# Patient Record
Sex: Female | Born: 1985 | Race: White | Hispanic: No | Smoking: Current every day smoker
Health system: Southern US, Community
[De-identification: ages and names within clinical notes are randomized; demographics above are authoritative.]

## PROBLEM LIST (undated history)

## (undated) DIAGNOSIS — F429 Obsessive-compulsive disorder, unspecified: Secondary | ICD-10-CM

## (undated) DIAGNOSIS — F329 Major depressive disorder, single episode, unspecified: Secondary | ICD-10-CM

## (undated) DIAGNOSIS — I2699 Other pulmonary embolism without acute cor pulmonale: Secondary | ICD-10-CM

## (undated) DIAGNOSIS — I10 Essential (primary) hypertension: Secondary | ICD-10-CM

## (undated) DIAGNOSIS — F419 Anxiety disorder, unspecified: Secondary | ICD-10-CM

## (undated) DIAGNOSIS — F32A Depression, unspecified: Secondary | ICD-10-CM

## (undated) HISTORY — PX: ABDOMINAL HYSTERECTOMY: SHX81

## (undated) HISTORY — PX: APPENDECTOMY: SHX54

---

## 2018-05-07 ENCOUNTER — Emergency Department (HOSPITAL_COMMUNITY)
Admission: EM | Admit: 2018-05-07 | Discharge: 2018-05-07 | Disposition: A | Discharge status: Home / Self Care | Payer: Self-pay | Attending: Emergency Medicine | Admitting: Emergency Medicine

## 2018-05-07 ENCOUNTER — Other Ambulatory Visit: Payer: Self-pay

## 2018-05-07 ENCOUNTER — Encounter (HOSPITAL_COMMUNITY): Payer: Self-pay | Admitting: Emergency Medicine

## 2018-05-07 DIAGNOSIS — F10920 Alcohol use, unspecified with intoxication, uncomplicated: Secondary | ICD-10-CM | POA: Insufficient documentation

## 2018-05-07 DIAGNOSIS — R1110 Vomiting, unspecified: Secondary | ICD-10-CM | POA: Insufficient documentation

## 2018-05-07 DIAGNOSIS — F17210 Nicotine dependence, cigarettes, uncomplicated: Secondary | ICD-10-CM | POA: Insufficient documentation

## 2018-05-07 DIAGNOSIS — I10 Essential (primary) hypertension: Secondary | ICD-10-CM | POA: Insufficient documentation

## 2018-05-07 HISTORY — DX: Major depressive disorder, single episode, unspecified: F32.9

## 2018-05-07 HISTORY — DX: Essential (primary) hypertension: I10

## 2018-05-07 HISTORY — DX: Obsessive-compulsive disorder, unspecified: F42.9

## 2018-05-07 HISTORY — DX: Depression, unspecified: F32.A

## 2018-05-07 HISTORY — DX: Anxiety disorder, unspecified: F41.9

## 2018-05-07 NOTE — ED Notes (Signed)
Patient father came to pick her and her boyfriend up [phone].
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
## 2018-05-07 NOTE — ED Provider Notes (Signed)
   [facility]-EMERGENCY DEPT Provider Note: Lowella DellJ. Lane Nashly Olsson, MD, FACEP  CSN: [id number] MRN: [id number] ARRIVAL: 05/07/18 at 0105 ROOM: [facility]   CHIEF COMPLAINT  Alcohol Intoxication   HISTORY OF PRESENT ILLNESS  05/07/18 1:19 AM Gabrielle Rose is a 32 y.o. female who went out drinking last night to celebrate her upcoming birthday.  She had multiple beers, shots of liquor and mixed drinks.  She was brought by ambulance for intoxication.  She admits to "drinking too much".  She has been vomiting but declined Zofran due to it causing headaches.  She denies abdominal pain.   Past Medical History:  Diagnosis Date  . Anxiety   . Depression   . Hypertension   . OCD (obsessive compulsive disorder)     History reviewed. No pertinent surgical history.  History reviewed. No pertinent family history.  Social History   Tobacco Use  . Smoking status: Current Every Day Smoker    Types: Cigarettes  . Smokeless tobacco: Never Used  Substance Use Topics  . Alcohol use: Yes  . Drug use: Never    Prior to Admission medications   Not on File    Allergies Erythromycin; Macrobid [nitrofurantoin macrocrystal]; Penicillins; and Zofran [ondansetron hcl]   REVIEW OF SYSTEMS  Negative except as noted here or in the History of Present Illness.   PHYSICAL EXAMINATION  Initial Vital Signs Blood pressure (!) 143/85, pulse (!) 106, temperature 97.6 F (36.4 C), temperature source Oral, resp. rate 18, height 5' (1.524 m), weight 120.2 kg (265 lb), SpO2 94 %.  Examination General: Well-developed, well-nourished female in no acute distress; appearance consistent with age of record HENT: normocephalic; atraumatic Eyes: pupils equal, round and reactive to light; extraocular muscles intact Neck: supple Heart: regular rate and rhythm Lungs: clear to auscultation bilaterally Abdomen: soft; nondistended; nontender; bowel sounds present Extremities: No deformity; full range of motion; pulses  normal Neurologic: Awake, alert and oriented; motor function intact in all extremities and symmetric; no facial droop Skin: Warm and dry Psychiatric: Flat affect   RESULTS  Summary of this visit's results, reviewed by myself:   EKG Interpretation  Date/Time:    Ventricular Rate:    PR Interval:    QRS Duration:   QT Interval:    QTC Calculation:   R Axis:     Text Interpretation:        Laboratory Studies: No results found for this or any previous visit (from the past 24 hour(s)). Imaging Studies: No results found.  ED COURSE and MDM  Nursing notes and initial vitals signs, including pulse oximetry, reviewed.  Vitals:   05/07/18 0107 05/07/18 0117  BP: (!) 143/85   Pulse: (!) 106   Resp: 18   Temp: 97.6 F (36.4 C)   TempSrc: Oral   SpO2: 94%   Weight:  120.2 kg (265 lb)  Height:  5' (1.524 m)   2:13 AM Patient's father here ready to take her home.  She appears awake and alert enough to be discharged at this time.  PROCEDURES    ED DIAGNOSES     ICD-10-CM   1. Alcoholic intoxication without complication (HCC) F10.920        Toccara Alford, Jonny RuizJohn, MD 05/07/18 [id number]

## 2018-07-09 ENCOUNTER — Emergency Department (INDEPENDENT_AMBULATORY_CARE_PROVIDER_SITE_OTHER): Payer: BLUE CROSS/BLUE SHIELD

## 2018-07-09 ENCOUNTER — Emergency Department (INDEPENDENT_AMBULATORY_CARE_PROVIDER_SITE_OTHER)
Admission: EM | Admit: 2018-07-09 | Discharge: 2018-07-09 | Disposition: A | Discharge status: Home / Self Care | Payer: BLUE CROSS/BLUE SHIELD | Source: Home / Self Care | Attending: Family Medicine | Admitting: Family Medicine

## 2018-07-09 ENCOUNTER — Other Ambulatory Visit: Payer: Self-pay

## 2018-07-09 DIAGNOSIS — M79671 Pain in right foot: Secondary | ICD-10-CM

## 2018-07-09 DIAGNOSIS — W5503XA Scratched by cat, initial encounter: Secondary | ICD-10-CM

## 2018-07-09 DIAGNOSIS — S9031XA Contusion of right foot, initial encounter: Secondary | ICD-10-CM | POA: Diagnosis not present

## 2018-07-09 NOTE — Discharge Instructions (Addendum)
°  You may take 500mg  acetaminophen every 4-6 hours or in combination with ibuprofen 400-600mg  every 6-8 hours as needed for pain and inflammation.  Keep cat scratch on your toe clean with warm water and mild soap. You may apply and over the counter antibiotic ointment for 4-5 days to help with healing. Please have it reevaluated if concerned for infection- increased pain, redness, drainage or pus.   Please follow up with family medicine in 1-2 weeks if not improving.

## 2018-07-09 NOTE — ED Provider Notes (Signed)
[facility]    CSN: [id number] Arrival date & time: 07/09/18  1253     History   Chief Complaint Chief Complaint  Patient presents with  . Foot Injury    HPI Gabrielle Rose is a 32 y.o. female.   HPI  Gabrielle Rose is a 32 y.o. female presenting to UC with c/o Right foot pain at the base of her 3rd through 5th toes after her cat knocked down several cans of cat food onto her foot 2 days ago. The cat also scratched her 4th toe. Pain is sharp, aching, and worse with ambulation despite trying to buddy tape her toes.  He believes she may have fractured her Right little toe in the past but not the 3rd or 4th toes.     Past Medical History:  Diagnosis Date  . Anxiety   . Depression   . Hypertension   . OCD (obsessive compulsive disorder)     There are no active problems to display for this patient.   History reviewed. No pertinent surgical history.  OB History   None      Home Medications    Prior to Admission medications   Medication Sig Start Date End Date Taking? Authorizing Provider  ALPRAZolam Prudy Feeler) 1 MG tablet Take 1 mg by mouth at bedtime as needed for anxiety.   Yes [provider]  escitalopram (LEXAPRO) 10 MG tablet Take 10 mg by mouth daily.   Yes [provider]  pantoprazole (PROTONIX) 40 MG tablet Take 40 mg by mouth daily.   Yes [provider]  QUEtiapine (SEROQUEL) 25 MG tablet Take 25 mg by mouth at bedtime.   Yes [provider]  topiramate (TOPAMAX) 50 MG tablet Take 50 mg by mouth 2 (two) times daily.   Yes [provider]    Family History History reviewed. No pertinent family history.  Social History Social History   Tobacco Use  . Smoking status: Current Every Day Smoker    Types: Cigarettes  . Smokeless tobacco: Never Used  Substance Use Topics  . Alcohol use: Yes  . Drug use: Never     Allergies   Erythromycin; Macrobid [nitrofurantoin macrocrystal];  Penicillins; and Zofran [ondansetron hcl]   Review of Systems Review of Systems  Musculoskeletal: Positive for arthralgias, gait problem and joint swelling. Negative for myalgias.  Skin: Positive for wound. Negative for color change and rash.  Neurological: Negative for weakness and numbness.     Physical Exam Triage Vital Signs ED Triage Vitals [07/09/18 1312]  Enc Vitals Group     BP 121/87     Pulse Rate (!) 108     Resp      Temp 98.2 F (36.8 C)     Temp Source Oral     SpO2 100 %     Weight      Height      Head Circumference      Peak Flow      Pain Score      Pain Loc      Pain Edu?      Excl. in GC?    No data found.  Updated Vital Signs BP 121/87 (BP Location: Right Arm)   Pulse (!) 108   Temp 98.2 F (36.8 C) (Oral)   Ht 5\' 2"  (1.575 m)   Wt 253 lb 12.8 oz (115.1 kg)   SpO2 100%   BMI 46.42 kg/m   Visual Acuity Right Eye Distance:  Left Eye Distance:   Bilateral Distance:    Right Eye Near:   Left Eye Near:    Bilateral Near:     Physical Exam  Constitutional: She is oriented to person, place, and time. She appears well-developed and well-nourished.  HENT:  Head: Normocephalic and atraumatic.  Eyes: EOM are normal.  Neck: Normal range of motion.  Cardiovascular: Normal rate.  Pulmonary/Chest: Effort normal.  Musculoskeletal: Normal range of motion. She exhibits edema and tenderness.  Right foot: mild edema at base of 3rd-5th toes. Tenderness.   Neurological: She is alert and oriented to person, place, and time.  Skin: Skin is warm and dry. Capillary refill takes less than 2 seconds. No erythema.  Right 4th toe: superficial abrasion c/w reported cat scratch. No erythema or warmth. No drainage. No red streaking.   Psychiatric: She has a normal mood and affect. Her behavior is normal.  Nursing note and vitals reviewed.    UC Treatments / Results  Labs (all labs ordered are listed, but only abnormal results are displayed) Labs Reviewed  - No data to display  EKG None  Radiology Dg Foot Complete Right  Result Date: 07/09/2018 CLINICAL DATA:  Right foot pain since the patient dropped cat food on her foot 2 days ago. Initial encounter. EXAM: RIGHT FOOT COMPLETE - 3+ VIEW COMPARISON:  None. FINDINGS: There is no evidence of fracture or dislocation. There is no evidence of arthropathy or other focal bone abnormality. Small plantar calcaneal spur incidentally noted. Soft tissues are unremarkable. IMPRESSION: Negative exam. Electronically Signed   By: Drusilla Kanner M.D.   On: 07/09/2018 13:32    Procedures Procedures (including critical care time)  Medications Ordered in UC Medications - No data to display  Initial Impression / Assessment and Plan / UC Course  I have reviewed the triage vital signs and the nursing notes.  Pertinent labs & imaging results that were available during my care of the patient were reviewed by me and considered in my medical decision making (see chart for details).     Reviewed imaging with pt. Reassured pt of no fracture. Ace wrap provided for comfort. Pt declined crutches Encouraged to continue home treatments for comfort.  Final Clinical Impressions(s) / UC Diagnoses   Final diagnoses:  Right foot pain  Contusion of right foot, initial encounter  Cat scratch     Discharge Instructions      You may take 500mg  acetaminophen every 4-6 hours or in combination with ibuprofen 400-600mg  every 6-8 hours as needed for pain and inflammation.  Keep cat scratch on your toe clean with warm water and mild soap. You may apply and over the counter antibiotic ointment for 4-5 days to help with healing. Please have it reevaluated if concerned for infection- increased pain, redness, drainage or pus.   Please follow up with family medicine in 1-2 weeks if not improving.     ED Prescriptions    None     Controlled Substance Prescriptions [place] Controlled Substance Registry consulted? Not  Applicable   Lurene Shadow, PA-C 07/09/18 1422

## 2018-07-09 NOTE — ED Triage Notes (Signed)
Here with right 4th digit toe pain. Cat food fell on foot x2 dys ago. Sharp,achy pain noted with ambulation. Buddy taped injured toes.

## 2021-12-07 ENCOUNTER — Other Ambulatory Visit: Payer: Self-pay

## 2021-12-07 ENCOUNTER — Emergency Department (HOSPITAL_COMMUNITY): Payer: Medicaid Other

## 2021-12-07 ENCOUNTER — Emergency Department (HOSPITAL_COMMUNITY)
Admission: EM | Admit: 2021-12-07 | Discharge: 2021-12-07 | Disposition: A | Discharge status: Home / Self Care | Payer: Medicaid Other | Attending: Emergency Medicine | Admitting: Emergency Medicine

## 2021-12-07 ENCOUNTER — Encounter (HOSPITAL_COMMUNITY): Payer: Self-pay

## 2021-12-07 DIAGNOSIS — Z7901 Long term (current) use of anticoagulants: Secondary | ICD-10-CM | POA: Diagnosis not present

## 2021-12-07 DIAGNOSIS — R000 Tachycardia, unspecified: Secondary | ICD-10-CM | POA: Diagnosis not present

## 2021-12-07 DIAGNOSIS — M5441 Lumbago with sciatica, right side: Secondary | ICD-10-CM | POA: Insufficient documentation

## 2021-12-07 DIAGNOSIS — X509XXA Other and unspecified overexertion or strenuous movements or postures, initial encounter: Secondary | ICD-10-CM | POA: Insufficient documentation

## 2021-12-07 DIAGNOSIS — M5442 Lumbago with sciatica, left side: Secondary | ICD-10-CM | POA: Diagnosis not present

## 2021-12-07 HISTORY — DX: Other pulmonary embolism without acute cor pulmonale: I26.99

## 2021-12-07 LAB — URINALYSIS, ROUTINE W REFLEX MICROSCOPIC
Bilirubin Urine: NEGATIVE
Glucose, UA: NEGATIVE mg/dL
Hgb urine dipstick: NEGATIVE
Ketones, ur: NEGATIVE mg/dL
Leukocytes,Ua: NEGATIVE
Nitrite: NEGATIVE
Protein, ur: NEGATIVE mg/dL
Specific Gravity, Urine: 1.003 — ABNORMAL LOW (ref 1.005–1.030)
pH: 7 (ref 5.0–8.0)

## 2021-12-07 LAB — BASIC METABOLIC PANEL
Anion gap: 10 (ref 5–15)
BUN: 8 mg/dL (ref 6–20)
CO2: 22 mmol/L (ref 22–32)
Calcium: 9.2 mg/dL (ref 8.9–10.3)
Chloride: 107 mmol/L (ref 98–111)
Creatinine, Ser: 0.67 mg/dL (ref 0.44–1.00)
GFR, Estimated: >60 mL/min (ref >60)
Glucose, Bld: 92 mg/dL (ref 70–99)
Potassium: 4 mmol/L (ref 3.5–5.1)
Sodium: 139 mmol/L (ref 135–145)

## 2021-12-07 LAB — CBC WITH DIFFERENTIAL/PLATELET
Abs Immature Granulocytes: 0.02 10*3/uL (ref 0.00–0.07)
Basophils Absolute: 0.1 10*3/uL (ref 0.0–0.1)
Basophils Relative: 1 %
Eosinophils Absolute: 0.8 10*3/uL — ABNORMAL HIGH (ref 0.0–0.5)
Eosinophils Relative: 8 %
HCT: 30 % — ABNORMAL LOW (ref 36.0–46.0)
Hemoglobin: 8.8 g/dL — ABNORMAL LOW (ref 12.0–15.0)
Immature Granulocytes: 0 %
Lymphocytes Relative: 22 %
Lymphs Abs: 2.2 10*3/uL (ref 0.7–4.0)
MCH: 22.8 pg — ABNORMAL LOW (ref 26.0–34.0)
MCHC: 29.3 g/dL — ABNORMAL LOW (ref 30.0–36.0)
MCV: 77.7 fL — ABNORMAL LOW (ref 80.0–100.0)
Monocytes Absolute: 0.4 10*3/uL (ref 0.1–1.0)
Monocytes Relative: 4 %
Neutro Abs: 6.5 10*3/uL (ref 1.7–7.7)
Neutrophils Relative %: 65 %
Platelets: 397 10*3/uL (ref 150–400)
RBC: 3.86 MIL/uL — ABNORMAL LOW (ref 3.87–5.11)
RDW: 17.2 % — ABNORMAL HIGH (ref 11.5–15.5)
WBC: 9.9 10*3/uL (ref 4.0–10.5)
nRBC: 0 % (ref 0.0–0.2)

## 2021-12-07 LAB — I-STAT BETA HCG BLOOD, ED (MC, WL, AP ONLY): I-stat hCG, quantitative: <5 m[IU]/mL (ref <5)

## 2021-12-07 MED ORDER — LORAZEPAM 2 MG/ML IJ SOLN
2.0000 mg | Freq: Once | INTRAMUSCULAR | Status: AC
  Administered 2021-12-07: 2 mg via INTRAVENOUS
  Filled 2021-12-07: qty 1

## 2021-12-07 MED ORDER — OXYCODONE HCL 5 MG PO TABS
5.0000 mg | ORAL_TABLET | Freq: Once | ORAL | Status: AC
  Administered 2021-12-07: 5 mg via ORAL
  Filled 2021-12-07: qty 1

## 2021-12-07 MED ORDER — METHOCARBAMOL 500 MG PO TABS: 500.0000 mg | ORAL_TABLET | Freq: Two times a day (BID) | ORAL | 0 refills | Status: AC

## 2021-12-07 MED ORDER — DEXAMETHASONE SODIUM PHOSPHATE 10 MG/ML IJ SOLN
16.0000 mg | Freq: Once | INTRAMUSCULAR | Status: AC
  Administered 2021-12-07: 16 mg via INTRAVENOUS
  Filled 2021-12-07: qty 2

## 2021-12-07 MED ORDER — DEXAMETHASONE SODIUM PHOSPHATE 10 MG/ML IJ SOLN
10.0000 mg | Freq: Once | INTRAMUSCULAR | Status: DC
  Filled 2021-12-07: qty 1

## 2021-12-07 MED ORDER — GADOBUTROL 1 MMOL/ML IV SOLN
7.0000 mL | Freq: Once | INTRAVENOUS | Status: AC | PRN
  Administered 2021-12-07: 7 mL via INTRAVENOUS
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
## 2021-12-07 NOTE — ED Notes (Addendum)
To MRI with pulse ox ?
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
## 2021-12-07 NOTE — Discharge Instructions (Addendum)
You may use Tylenol for your pain.  Gabapentin may also help.  I have sent muscle relaxants to your pharmacy.  Do not take these while driving as they may make you tired. ? ?I have attached a neurosurgery office for you to follow-up with in the event that you continue to have back pain and numbness or urinary incontinence.  You may follow-up with your primary care provider as needed for chronic back pain. ?

## 2021-12-18 ENCOUNTER — Emergency Department (HOSPITAL_COMMUNITY)
Admission: EM | Admit: 2021-12-18 | Discharge: 2021-12-19 | Disposition: A | Discharge status: Home / Self Care | Payer: Medicaid Other | Attending: Emergency Medicine | Admitting: Emergency Medicine

## 2021-12-18 ENCOUNTER — Emergency Department (HOSPITAL_COMMUNITY): Payer: Medicaid Other

## 2021-12-18 DIAGNOSIS — R102 Pelvic and perineal pain: Secondary | ICD-10-CM | POA: Insufficient documentation

## 2021-12-18 DIAGNOSIS — D509 Iron deficiency anemia, unspecified: Secondary | ICD-10-CM | POA: Diagnosis not present

## 2021-12-18 DIAGNOSIS — Z7901 Long term (current) use of anticoagulants: Secondary | ICD-10-CM | POA: Diagnosis not present

## 2021-12-18 DIAGNOSIS — I10 Essential (primary) hypertension: Secondary | ICD-10-CM | POA: Insufficient documentation

## 2021-12-18 DIAGNOSIS — D75839 Thrombocytosis, unspecified: Secondary | ICD-10-CM | POA: Insufficient documentation

## 2021-12-18 LAB — CBC WITH DIFFERENTIAL/PLATELET
Abs Immature Granulocytes: 0.02 10*3/uL (ref 0.00–0.07)
Basophils Absolute: 0.1 10*3/uL (ref 0.0–0.1)
Basophils Relative: 1 %
Eosinophils Absolute: 0.5 10*3/uL (ref 0.0–0.5)
Eosinophils Relative: 6 %
HCT: 29.2 % — ABNORMAL LOW (ref 36.0–46.0)
Hemoglobin: 8.6 g/dL — ABNORMAL LOW (ref 12.0–15.0)
Immature Granulocytes: 0 %
Lymphocytes Relative: 36 %
Lymphs Abs: 2.7 10*3/uL (ref 0.7–4.0)
MCH: 23 pg — ABNORMAL LOW (ref 26.0–34.0)
MCHC: 29.5 g/dL — ABNORMAL LOW (ref 30.0–36.0)
MCV: 78.1 fL — ABNORMAL LOW (ref 80.0–100.0)
Monocytes Absolute: 0.4 10*3/uL (ref 0.1–1.0)
Monocytes Relative: 5 %
Neutro Abs: 3.9 10*3/uL (ref 1.7–7.7)
Neutrophils Relative %: 52 %
Platelets: 521 10*3/uL — ABNORMAL HIGH (ref 150–400)
RBC: 3.74 MIL/uL — ABNORMAL LOW (ref 3.87–5.11)
RDW: 17.2 % — ABNORMAL HIGH (ref 11.5–15.5)
WBC: 7.6 10*3/uL (ref 4.0–10.5)
nRBC: 0 % (ref 0.0–0.2)

## 2021-12-18 LAB — COMPREHENSIVE METABOLIC PANEL
ALT: 15 U/L (ref 0–44)
AST: 16 U/L (ref 15–41)
Albumin: 4.1 g/dL (ref 3.5–5.0)
Alkaline Phosphatase: 81 U/L (ref 38–126)
Anion gap: 11 (ref 5–15)
BUN: 7 mg/dL (ref 6–20)
CO2: 23 mmol/L (ref 22–32)
Calcium: 9.2 mg/dL (ref 8.9–10.3)
Chloride: 104 mmol/L (ref 98–111)
Creatinine, Ser: 0.75 mg/dL (ref 0.44–1.00)
GFR, Estimated: >60 mL/min (ref >60)
Glucose, Bld: 93 mg/dL (ref 70–99)
Potassium: 3.7 mmol/L (ref 3.5–5.1)
Sodium: 138 mmol/L (ref 135–145)
Total Bilirubin: 0.4 mg/dL (ref 0.3–1.2)
Total Protein: 7.1 g/dL (ref 6.5–8.1)

## 2021-12-18 LAB — URINALYSIS, ROUTINE W REFLEX MICROSCOPIC
Bilirubin Urine: NEGATIVE
Glucose, UA: NEGATIVE mg/dL
Hgb urine dipstick: NEGATIVE
Ketones, ur: NEGATIVE mg/dL
Leukocytes,Ua: NEGATIVE
Nitrite: NEGATIVE
Protein, ur: NEGATIVE mg/dL
Specific Gravity, Urine: 1.01 (ref 1.005–1.030)
pH: 7 (ref 5.0–8.0)

## 2021-12-18 LAB — LIPASE, BLOOD: Lipase: 28 U/L (ref 11–51)

## 2021-12-18 MED ORDER — PROCHLORPERAZINE EDISYLATE 10 MG/2ML IJ SOLN
10.0000 mg | Freq: Once | INTRAMUSCULAR | Status: AC
  Administered 2021-12-19: 10 mg via INTRAVENOUS
  Filled 2021-12-18: qty 2

## 2021-12-18 MED ORDER — IOHEXOL 300 MG/ML  SOLN
100.0000 mL | Freq: Once | INTRAMUSCULAR | Status: AC | PRN
  Administered 2021-12-18: 100 mL via INTRAVENOUS

## 2021-12-18 MED ORDER — MORPHINE SULFATE (PF) 4 MG/ML IV SOLN
4.0000 mg | Freq: Once | INTRAVENOUS | Status: AC
  Administered 2021-12-19: 4 mg via INTRAVENOUS
  Filled 2021-12-18: qty 1

## 2021-12-18 NOTE — ED Provider Notes (Signed)
?[facility] EMERGENCY DEPARTMENT ?Provider Note ? ? ?CSN: [id number] ?Arrival date & time: 12/18/21  2018 ? ?  ? ?History ? ?Chief Complaint  ?Patient presents with  ? Abdominal Pain  ? ? ?Gabrielle Rose is a 36 y.o. female. ? ?The history is provided by the patient.  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  Pain is moderately severe and she rates it at 8/10.  Nothing makes it better, nothing makes it worse.  There is associated nausea but no vomiting.  She denies any constipation or diarrhea and denies any urinary difficulty.  She has been having difficulty with ovarian cysts since a hysterectomy several years ago.  She does have history of polycystic ovarian disease and also has history of a right-sided dermoid cyst.  Pain she is having today is similar to what she has had with other ovarian cysts. ?  ?Home Medications ?Prior to Admission medications   ?Medication Sig Start Date End Date Taking? Authorizing Provider  ?albuterol (PROVENTIL) (2.5 MG/3ML) 0.083% nebulizer solution Take 2.5 mg by nebulization every 4 (four) hours as needed for wheezing or shortness of breath. 10/19/21   [provider]  ?ALPRAZolam Prudy Feeler) 1 MG tablet Take 1 mg by mouth at bedtime as needed for anxiety.    [provider]  ?escitalopram (LEXAPRO) 10 MG tablet Take 10 mg by mouth daily.    [provider]  ?gabapentin (NEURONTIN) 300 MG capsule Take 300 mg by mouth 2 (two) times daily.    [provider]  ?methocarbamol (ROBAXIN) 500 MG tablet Take 1 tablet (500 mg total) by mouth 2 (two) times daily. 12/07/21   Redwine, Madison A, PA-C  ?pantoprazole (PROTONIX) 40 MG tablet Take 40 mg by mouth daily.    [provider]  ?QUEtiapine (SEROQUEL) 25 MG tablet Take 25 mg by mouth at bedtime.    [provider]  ?rivaroxaban  (XARELTO) 20 MG TABS tablet Take 20 mg by mouth daily with supper.    [provider]  ?topiramate (TOPAMAX) 50 MG tablet Take 50 mg by mouth 2 (two) times daily.    [provider]  ?   ? ?Allergies    ?Erythromycin, Macrobid [nitrofurantoin macrocrystal], Penicillins, Nsaids, and Zofran [ondansetron hcl]   ? ?Review of Systems   ?Review of Systems  ?Gastrointestinal:  Positive for abdominal pain.  ?All other systems reviewed and are negative. ? ?Physical Exam ?Updated Vital Signs ?BP (!) 135/97   Pulse 98   Temp 98.2 ?F (36.8 ?C) (Oral)   Resp 20   SpO2 100%  ?Physical Exam ?Vitals and nursing note reviewed.  ?36 year old female, resting comfortably and in no acute distress. Vital signs are significant for elevated blood pressure. Oxygen saturation is 100%, which is normal. ?Head is normocephalic and atraumatic. PERRLA, EOMI. Oropharynx is clear. ?Neck is nontender and supple without adenopathy or JVD. ?Back is nontender and there is no CVA tenderness. ?Lungs are clear without rales, wheezes, or rhonchi. ?Chest is nontender. ?Heart has regular rate and rhythm without murmur. ?Abdomen is soft, flat, with mild right suprapubic tenderness.  There is no rebound or guarding.  There are no masses or hepatosplenomegaly and peristalsis is hypoactive. ?Extremities have no cyanosis or edema, full range of motion is present. ?Skin is warm and dry without rash. ?Neurologic: Mental status is normal, cranial nerves are intact, moves all extremities equally. ? ?  ED Results / Procedures / Treatments   ?Labs ?(all labs ordered are listed, but only abnormal results are displayed) ?Labs Reviewed  ?CBC WITH DIFFERENTIAL/PLATELET - Abnormal; Notable for the following components:  ?    Result Value  ? RBC 3.74 (*)   ? Hemoglobin 8.6 (*)   ? HCT 29.2 (*)   ? MCV 78.1 (*)   ? MCH 23.0 (*)   ? MCHC 29.5 (*)   ? RDW 17.2 (*)   ? Platelets 521 (*)   ? All other components within normal limits  ?URINALYSIS, ROUTINE W  REFLEX MICROSCOPIC - Abnormal; Notable for the following components:  ? APPearance CLOUDY (*)   ? All other components within normal limits  ?LIPASE, BLOOD  ?COMPREHENSIVE METABOLIC PANEL  ? ?Radiology ?CT ABDOMEN PELVIS W CONTRAST ? ?Result Date: 12/18/2021 ?CLINICAL DATA:  Right lower quadrant pain. EXAM: CT ABDOMEN AND PELVIS WITH CONTRAST TECHNIQUE: Multidetector CT imaging of the abdomen and pelvis was performed using the standard protocol following bolus administration of intravenous contrast. RADIATION DOSE REDUCTION: This exam was performed according to the departmental dose-optimization program which includes automated exposure control, adjustment of the mA and/or kV according to patient size and/or use of iterative reconstruction technique. CONTRAST:  OMNIPAQUE IOHEXOL 300 MG/ML  SOLN COMPARISON:  Feb 13, 2010 FINDINGS: Lower chest: No acute abnormality. Hepatobiliary: No focal liver abnormality is seen. No gallstones, gallbladder wall thickening, or biliary dilatation. Pancreas: Unremarkable. No pancreatic ductal dilatation or surrounding inflammatory changes. Spleen: Normal in size without focal abnormality. Adrenals/Urinary Tract: Adrenal glands are unremarkable. Kidneys are normal, without renal calculi, focal lesion, or hydronephrosis. Bladder is unremarkable. Stomach/Bowel: Stomach is within normal limits. The appendix is surgically absent. No evidence of bowel wall thickening, distention, or inflammatory changes. Vascular/Lymphatic: No significant vascular findings are present. No enlarged abdominal or pelvic lymph nodes. Reproductive: Uterus is not identified. The bilateral adnexa are unremarkable. Other: No abdominal wall hernia or abnormality. No abdominopelvic ascites. Musculoskeletal: No acute or significant osseous findings. IMPRESSION: 1. No acute or active process within the abdomen or pelvis. 2. Evidence of prior appendectomy. Electronically Signed   By: Aram Candela M.D.   On:  12/18/2021 23:40   ? ?Procedures ?Procedures  ? ? ?Medications Ordered in ED ?Medications  ?morphine (PF) 4 MG/ML injection 4 mg (has no administration in time range)  ?prochlorperazine (COMPAZINE) injection 10 mg (has no administration in time range)  ?iohexol (OMNIPAQUE) 300 MG/ML solution 100 mL (100 mLs Intravenous Contrast Given 12/18/21 2336)  ? ? ?ED Course/ Medical Decision Making/ A&P ?  ?                        ?Medical Decision Making ?Risk ?Prescription drug management. ? ? ?Right sided pelvic pain and patient with history of ovarian cyst, likely recurrent ovarian cyst.  She is status post hysterectomy and appendectomy, so appendicitis and PID are not diagnostic considerations.  Possible UTI, ureterolithiasis, diverticulitis, endometriosis, pelvic adhesions.  Labs show stable anemia.  Thrombocytosis is present.  Urinalysis is unremarkable.  She is sent for CT of abdomen and pelvis to look for possible you ureteral calculus, possible diverticulitis.  CT scan shows no acute process, no significant ovarian cyst.  I have independently viewed the images, and agree with the radiologist's interpretation.  She is referred back to her gynecologist for further outpatient work-up.  She is given prescription for small number of hydrocodone tablets and also prescription for metoclopramide.  Return  precautions discussed. ? ? ? ? ? ? ? ?Final Clinical Impression(s) / ED Diagnoses ?Final diagnoses:  ?Pelvic pain  ?Microcytic anemia  ?Thrombocytosis  ? ? ?Rx / DC Orders ?ED Discharge Orders   ? ?      Ordered  ?  HYDROcodone-acetaminophen (NORCO) 5-325 MG tablet  Every 4 hours PRN       ? 12/19/21 0051  ?  metoCLOPramide (REGLAN) 10 MG tablet  Every 6 hours       ? 12/19/21 0051  ? ?  ?  ? ?  ? ? ?  ?Dione BoozeGlick, Lexa Coronado, MD ?12/19/21 [phone] ? ?

## 2021-12-18 NOTE — ED Triage Notes (Signed)
Pt reported to ED with c/o pain to right lower abdomen that she is referring to as "pelvic pain". Pt states she thinks she has an ovarian cyst, has been having -pain since hysterectomy 2017 with ovaries left in tact. Pt also is experiencing nausea. Denies any bleeding or discharge at this time.  ?

## 2021-12-18 NOTE — ED Provider Triage Note (Signed)
Emergency Medicine Provider Triage Evaluation Note ? ?Gabrielle Rose , a 36 y.o. female  was evaluated in triage.  Pt complains of right lower quadrant abdominal pain of 2-day duration.  Patient denies fever, nausea, vomiting.  She has history of PCOS, ovarian cysts.  Patient follows with gynecology.  She states she was started on metformin but she could not tolerate this.  Denies dysuria, vaginal discharge.  She has history of multiple surgeries including hysterectomy, appendectomy. ? ?Review of Systems  ?Positive: As above ?Negative: As above ? ?Physical Exam  ?BP 135/87 (BP Location: Left Arm)   Pulse (!) 101   Temp 98.2 ?F (36.8 ?C) (Oral)   Resp 20   SpO2 100%  ?Gen:   Awake, no distress   ?Resp:  Normal effort  ?MSK:   Moves extremities without difficulty  ?Other:  Tenderness to palpation present in the right lower quadrant.  Without flank pain. ? ?Medical Decision Making  ?Medically screening exam initiated at 9:24 PM.  Appropriate orders placed.  Gabrielle Rose was informed that the remainder of the evaluation will be completed by another provider, this initial triage assessment does not replace that evaluation, and the importance of remaining in the ED until their evaluation is complete. ? ? ?  ?Marita Kansas, PA-C ?12/18/21 2126 ? ?

## 2021-12-19 MED ORDER — METOCLOPRAMIDE HCL 10 MG PO TABS: 10.0000 mg | ORAL_TABLET | Freq: Four times a day (QID) | ORAL | 0 refills | Status: AC

## 2021-12-19 MED ORDER — HYDROCODONE-ACETAMINOPHEN 5-325 MG PO TABS: 1.0000 | ORAL_TABLET | ORAL | 0 refills | Status: AC | PRN

## 2021-12-19 NOTE — Discharge Instructions (Signed)
Your evaluation today did not show any sign of an active ovarian cyst.  Pain could be related to ongoing endometriosis, scar tissue from your surgery.  Please follow-up with your gynecologist for further outpatient evaluation.  Return if pain is getting worse or if you start vomiting or start running a fever. ?

## 2021-12-27 ENCOUNTER — Other Ambulatory Visit: Payer: Self-pay

## 2021-12-27 ENCOUNTER — Emergency Department (HOSPITAL_BASED_OUTPATIENT_CLINIC_OR_DEPARTMENT_OTHER)
Admission: EM | Admit: 2021-12-27 | Discharge: 2021-12-27 | Disposition: A | Discharge status: Home / Self Care | Payer: Medicaid Other | Attending: Emergency Medicine | Admitting: Emergency Medicine

## 2021-12-27 ENCOUNTER — Encounter (HOSPITAL_BASED_OUTPATIENT_CLINIC_OR_DEPARTMENT_OTHER): Payer: Self-pay

## 2021-12-27 DIAGNOSIS — K0889 Other specified disorders of teeth and supporting structures: Secondary | ICD-10-CM | POA: Insufficient documentation

## 2021-12-27 MED ORDER — OXYCODONE-ACETAMINOPHEN 5-325 MG PO TABS: 1.0000 | ORAL_TABLET | Freq: Four times a day (QID) | ORAL | 0 refills | Status: AC | PRN

## 2021-12-27 MED ORDER — CLINDAMYCIN HCL 150 MG PO CAPS: 450.0000 mg | ORAL_CAPSULE | Freq: Three times a day (TID) | ORAL | 0 refills | Status: AC

## 2021-12-27 MED ORDER — TRAMADOL HCL 50 MG PO TABS
50.0000 mg | ORAL_TABLET | Freq: Once | ORAL | Status: AC
  Administered 2021-12-27: 50 mg via ORAL
  Filled 2021-12-27: qty 1

## 2021-12-27 NOTE — ED Provider Notes (Signed)
?[facility] EMERGENCY DEPARTMENT ?Provider Note ? ? ?CSN: [id number] ?Arrival date & time: 12/27/21  1724 ? ?  ? ?History ?Chief Complaint  ?Patient presents with  ? Dental Pain  ? ? ?Kween Brileigh Sevcik is a 36 y.o. female who presents to the emergency department with dental pain.  Patient has a broken tooth that has been there for quite some time.  Over the last few days it has become increasingly more painful.  She denies any fever.  No trouble swallowing.  No trouble talking.  She does have an appointment on Monday with her dentist. ? ? ?Dental Pain ? ?  ? ?Home Medications ?Prior to Admission medications   ?Medication Sig Start Date End Date Taking? Authorizing Provider  ?clindamycin (CLEOCIN) 150 MG capsule Take 3 capsules (450 mg total) by mouth 3 (three) times daily. 12/27/21  Yes Meredeth Ide, Calene Paradiso M, PA-C  ?oxyCODONE-acetaminophen (PERCOCET/ROXICET) 5-325 MG tablet Take 1 tablet by mouth every 6 (six) hours as needed for severe pain. 12/27/21  Yes Meredeth Ide, Yosiel Thieme M, PA-C  ?albuterol (PROVENTIL) (2.5 MG/3ML) 0.083% nebulizer solution Take 2.5 mg by nebulization every 4 (four) hours as needed for wheezing or shortness of breath. 10/19/21   [provider]  ?ALPRAZolam Prudy Feeler) 1 MG tablet Take 1 mg by mouth at bedtime as needed for anxiety.    [provider]  ?escitalopram (LEXAPRO) 10 MG tablet Take 10 mg by mouth daily.    [provider]  ?gabapentin (NEURONTIN) 300 MG capsule Take 300 mg by mouth 2 (two) times daily.    [provider]  ?HYDROcodone-acetaminophen (NORCO) 5-325 MG tablet Take 1 tablet by mouth every 4 (four) hours as needed for moderate pain. 12/19/21   Dione Booze, MD  ?methocarbamol (ROBAXIN) 500 MG tablet Take 1 tablet (500 mg total) by mouth 2 (two) times daily. 12/07/21   Redwine, Madison A, PA-C  ?metoCLOPramide (REGLAN) 10 MG tablet Take 1 tablet (10 mg total) by mouth every 6 (six) hours. 12/19/21   Dione Booze, MD  ?pantoprazole (PROTONIX)  40 MG tablet Take 40 mg by mouth daily.    [provider]  ?QUEtiapine (SEROQUEL) 25 MG tablet Take 25 mg by mouth at bedtime.    [provider]  ?rivaroxaban (XARELTO) 20 MG TABS tablet Take 20 mg by mouth daily with supper.    [provider]  ?topiramate (TOPAMAX) 50 MG tablet Take 50 mg by mouth 2 (two) times daily.    [provider]  ?   ? ?Allergies    ?Erythromycin, Macrobid [nitrofurantoin macrocrystal], Penicillins, Nsaids, and Zofran [ondansetron hcl]   ? ?Review of Systems   ?Review of Systems  ?All other systems reviewed and are negative. ? ?Physical Exam ?Updated Vital Signs ?BP (!) 143/93 (BP Location: Left Arm)   Pulse (!) 102   Temp 98 ?F (36.7 ?C) (Oral)   Resp 18   Ht 4\' 11"  (1.499 m)   Wt 84.4 kg   SpO2 100%   BMI 37.57 kg/m?  ?Physical Exam ?Vitals and nursing note reviewed.  ?Constitutional:   ?   Appearance: Normal appearance.  ?HENT:  ?   Head: Normocephalic and atraumatic.  ?   Mouth/Throat:  ?   Pharynx: Oropharynx is clear. Uvula midline. No pharyngeal swelling, oropharyngeal exudate or uvula swelling.  ?   Comments: Poor dentition throughout.  There is evidence of broken tooth over the left upper lateral incisor.  No obvious purulence or overlying abscess.  Patient talking in  complete sentences.  Able to tolerate her own secretions. ?Eyes:  ?   General:     ?   Right eye: No discharge.     ?   Left eye: No discharge.  ?   Conjunctiva/sclera: Conjunctivae normal.  ?Pulmonary:  ?   Effort: Pulmonary effort is normal.  ?Skin: ?   General: Skin is warm and dry.  ?   Findings: No rash.  ?Neurological:  ?   General: No focal deficit present.  ?   Mental Status: She is alert.  ?Psychiatric:     ?   Mood and Affect: Mood normal.     ?   Behavior: Behavior normal.  ? ? ?ED Results / Procedures / Treatments   ?Labs ?(all labs ordered are listed, but only abnormal results are displayed) ?Labs Reviewed - No data to display ? ?EKG ?None ? ?Radiology ?No  results found. ? ?Procedures ?Procedures  ? ? ?Medications Ordered in ED ?Medications  ?traMADol (ULTRAM) tablet 50 mg (50 mg Oral Given 12/27/21 1806)  ? ? ?ED Course/ Medical Decision Making/ A&P ?  ?                        ?Medical Decision Making ? ?Loyce Madyn Ivins is a 36 y.o. female who presents to the emergency department with dental pain.  This is likely pain from her broken tooth. No obvious abscess. I doubt ludwig's angina. Antibiotics and pain medication sent to her pharmacy. She is non toxic appearing. No evidence of PTA or RPA. Strict return precautions were given. She is safe for discharge.  ? ?Final Clinical Impression(s) / ED Diagnoses ?Final diagnoses:  ?Pain, dental  ? ? ?Rx / DC Orders ?ED Discharge Orders   ? ?      Ordered  ?  clindamycin (CLEOCIN) 150 MG capsule  3 times daily       ? 12/27/21 1809  ?  oxyCODONE-acetaminophen (PERCOCET/ROXICET) 5-325 MG tablet  Every 6 hours PRN       ? 12/27/21 1809  ? ?  ?  ? ?  ? ? ?  ?Honor Loh [place], [place] ?12/27/21 1814 ? ?  ?Charlynne Pander, MD ?12/27/21 2256 ? ?

## 2021-12-27 NOTE — Discharge Instructions (Signed)
Please follow-up with your dentist on Monday.  I sent an antibiotic and pain medication to your pharmacy.  Pick these up today.  Please return to the emergency department for any worsening symptoms. ?

## 2021-12-27 NOTE — ED Triage Notes (Signed)
Dental pain to left upper jaw since Thursday. States has a dentist appt Monday, however pain has worsened. Poor dental hygiene. ?

## 2022-01-04 ENCOUNTER — Emergency Department (HOSPITAL_BASED_OUTPATIENT_CLINIC_OR_DEPARTMENT_OTHER): Payer: Medicaid Other

## 2022-01-04 ENCOUNTER — Encounter (HOSPITAL_BASED_OUTPATIENT_CLINIC_OR_DEPARTMENT_OTHER): Payer: Self-pay | Admitting: Emergency Medicine

## 2022-01-04 ENCOUNTER — Emergency Department (HOSPITAL_BASED_OUTPATIENT_CLINIC_OR_DEPARTMENT_OTHER)
Admission: EM | Admit: 2022-01-04 | Discharge: 2022-01-04 | Disposition: A | Discharge status: Home / Self Care | Payer: Medicaid Other | Attending: Emergency Medicine | Admitting: Emergency Medicine

## 2022-01-04 ENCOUNTER — Other Ambulatory Visit: Payer: Self-pay

## 2022-01-04 DIAGNOSIS — D649 Anemia, unspecified: Secondary | ICD-10-CM | POA: Diagnosis not present

## 2022-01-04 DIAGNOSIS — Z86718 Personal history of other venous thrombosis and embolism: Secondary | ICD-10-CM | POA: Diagnosis not present

## 2022-01-04 DIAGNOSIS — Z7901 Long term (current) use of anticoagulants: Secondary | ICD-10-CM | POA: Insufficient documentation

## 2022-01-04 DIAGNOSIS — R1032 Left lower quadrant pain: Secondary | ICD-10-CM | POA: Insufficient documentation

## 2022-01-04 DIAGNOSIS — Z86711 Personal history of pulmonary embolism: Secondary | ICD-10-CM | POA: Diagnosis not present

## 2022-01-04 LAB — BASIC METABOLIC PANEL
Anion gap: 9 (ref 5–15)
BUN: 6 mg/dL (ref 6–20)
CO2: 23 mmol/L (ref 22–32)
Calcium: 8.8 mg/dL — ABNORMAL LOW (ref 8.9–10.3)
Chloride: 105 mmol/L (ref 98–111)
Creatinine, Ser: 0.76 mg/dL (ref 0.44–1.00)
GFR, Estimated: >60 mL/min (ref >60)
Glucose, Bld: 90 mg/dL (ref 70–99)
Potassium: 3.8 mmol/L (ref 3.5–5.1)
Sodium: 137 mmol/L (ref 135–145)

## 2022-01-04 LAB — CBC
HCT: 25.4 % — ABNORMAL LOW (ref 36.0–46.0)
Hemoglobin: 7.6 g/dL — ABNORMAL LOW (ref 12.0–15.0)
MCH: 22.6 pg — ABNORMAL LOW (ref 26.0–34.0)
MCHC: 29.9 g/dL — ABNORMAL LOW (ref 30.0–36.0)
MCV: 75.6 fL — ABNORMAL LOW (ref 80.0–100.0)
Platelets: 419 10*3/uL — ABNORMAL HIGH (ref 150–400)
RBC: 3.36 MIL/uL — ABNORMAL LOW (ref 3.87–5.11)
RDW: 17.2 % — ABNORMAL HIGH (ref 11.5–15.5)
WBC: 10 10*3/uL (ref 4.0–10.5)
nRBC: 0 % (ref 0.0–0.2)

## 2022-01-04 LAB — URINALYSIS, ROUTINE W REFLEX MICROSCOPIC
Bilirubin Urine: NEGATIVE
Glucose, UA: NEGATIVE mg/dL
Hgb urine dipstick: NEGATIVE
Ketones, ur: NEGATIVE mg/dL
Leukocytes,Ua: NEGATIVE
Nitrite: NEGATIVE
Protein, ur: NEGATIVE mg/dL
Specific Gravity, Urine: 1.01 (ref 1.005–1.030)
pH: 5.5 (ref 5.0–8.0)

## 2022-01-04 MED ORDER — PROCHLORPERAZINE EDISYLATE 10 MG/2ML IJ SOLN
10.0000 mg | Freq: Once | INTRAMUSCULAR | Status: AC
  Administered 2022-01-04: 10 mg via INTRAVENOUS
  Filled 2022-01-04: qty 2

## 2022-01-04 MED ORDER — FERROUS SULFATE 325 (65 FE) MG PO TABS: 325.0000 mg | ORAL_TABLET | Freq: Every day | ORAL | 1 refills | Status: AC

## 2022-01-04 MED ORDER — HYDROMORPHONE HCL 1 MG/ML IJ SOLN
1.0000 mg | Freq: Once | INTRAMUSCULAR | Status: AC
Start: 2022-01-04 — End: 2022-01-04
  Administered 2022-01-04: 1 mg via INTRAVENOUS
  Filled 2022-01-04: qty 1

## 2022-01-04 MED ORDER — DOCUSATE SODIUM 100 MG PO CAPS: 100.0000 mg | ORAL_CAPSULE | Freq: Two times a day (BID) | ORAL | 0 refills | Status: AC

## 2022-01-04 MED ORDER — SODIUM CHLORIDE 0.9 % IV SOLN: 12.5000 mg | Freq: Four times a day (QID) | INTRAVENOUS | Status: DC | PRN

## 2022-01-04 NOTE — ED Provider Notes (Signed)
?[facility] EMERGENCY DEPARTMENT ?Provider Note ? ? ?CSN: [id number] ?Arrival date & time: 01/04/22  0656 ? ?  ? ?History ? ?Chief Complaint  ?Patient presents with  ? Abdominal Pain  ? ? ?Gabrielle Rose is a 36 y.o. female with a history of PCOS, partial hysterectomy, DVT and PE on Xarelto, presenting to the ED with left lower quadrant abdominal pain and left pelvic pain.  She reports abrupt onset yesterday evening around 7 PM.  She says it feels like when she gets "ruptured cyst" which happen bilaterally occasionally.  The pain however has been getting consistently worse overnight.  It is severe this morning.  She feels nauseated.  She denies vomiting or diarrhea. ? ?HPI ? ?  ? ?Home Medications ?Prior to Admission medications   ?Medication Sig Start Date End Date Taking? Authorizing Provider  ?docusate sodium (COLACE) 100 MG capsule Take 1 capsule (100 mg total) by mouth every 12 (twelve) hours. 01/04/22  Yes Rushie Brazel, Kermit Balo, MD  ?ferrous sulfate 325 (65 FE) MG tablet Take 1 tablet (325 mg total) by mouth daily for 30 doses. 01/04/22 02/03/22 Yes Lourene Hoston, Kermit Balo, MD  ?albuterol (PROVENTIL) (2.5 MG/3ML) 0.083% nebulizer solution Take 2.5 mg by nebulization every 4 (four) hours as needed for wheezing or shortness of breath. 10/19/21   [provider]  ?ALPRAZolam Prudy Feeler) 1 MG tablet Take 1 mg by mouth at bedtime as needed for anxiety.    [provider]  ?clindamycin (CLEOCIN) 150 MG capsule Take 3 capsules (450 mg total) by mouth 3 (three) times daily. 12/27/21   Teressa Lower, PA-C  ?escitalopram (LEXAPRO) 10 MG tablet Take 10 mg by mouth daily.    [provider]  ?gabapentin (NEURONTIN) 300 MG capsule Take 300 mg by mouth 2 (two) times daily.    [provider]  ?HYDROcodone-acetaminophen (NORCO) 5-325 MG tablet Take 1 tablet by mouth every 4 (four) hours as needed for moderate pain. 12/19/21   Dione Booze, MD  ?methocarbamol (ROBAXIN) 500 MG tablet Take 1  tablet (500 mg total) by mouth 2 (two) times daily. 12/07/21   Redwine, Madison A, PA-C  ?metoCLOPramide (REGLAN) 10 MG tablet Take 1 tablet (10 mg total) by mouth every 6 (six) hours. 12/19/21   Dione Booze, MD  ?oxyCODONE-acetaminophen (PERCOCET/ROXICET) 5-325 MG tablet Take 1 tablet by mouth every 6 (six) hours as needed for severe pain. 12/27/21   Honor Loh M, PA-C  ?pantoprazole (PROTONIX) 40 MG tablet Take 40 mg by mouth daily.    [provider]  ?QUEtiapine (SEROQUEL) 25 MG tablet Take 25 mg by mouth at bedtime.    [provider]  ?rivaroxaban (XARELTO) 20 MG TABS tablet Take 20 mg by mouth daily with supper.    [provider]  ?topiramate (TOPAMAX) 50 MG tablet Take 50 mg by mouth 2 (two) times daily.    [provider]  ?   ? ?Allergies    ?Erythromycin, Macrobid [nitrofurantoin macrocrystal], Penicillins, Nsaids, and Zofran [ondansetron hcl]   ? ?Review of Systems   ?Review of Systems ? ?Physical Exam ?Updated Vital Signs ?BP (!) 102/50   Pulse 68   Temp 98.2 ?F (36.8 ?C) (Oral)   Resp 16   Ht 4\' 11"  (1.499 m)   Wt 86.2 kg   SpO2 100%   BMI 38.38 kg/m?  ?Physical Exam ?Constitutional:   ?   General: She is not in acute distress. ?HENT:  ?   Head: Normocephalic and atraumatic.  ?  Eyes:  ?   Conjunctiva/sclera: Conjunctivae normal.  ?   Pupils: Pupils are equal, round, and reactive to light.  ?Cardiovascular:  ?   Rate and Rhythm: Normal rate and regular rhythm.  ?Pulmonary:  ?   Effort: Pulmonary effort is normal. No respiratory distress.  ?Abdominal:  ?   General: There is no distension.  ?   Tenderness: There is abdominal tenderness in the left lower quadrant. There is no guarding. Negative signs include Murphy's sign and McBurney's sign.  ?Skin: ?   General: Skin is warm and dry.  ?Neurological:  ?   General: No focal deficit present.  ?   Mental Status: She is alert. Mental status is at baseline.  ?Psychiatric:     ?   Mood and Affect: Mood normal.     ?    Behavior: Behavior normal.  ? ? ?ED Results / Procedures / Treatments   ?Labs ?(all labs ordered are listed, but only abnormal results are displayed) ?Labs Reviewed  ?BASIC METABOLIC PANEL - Abnormal; Notable for the following components:  ?    Result Value  ? Calcium 8.8 (*)   ? All other components within normal limits  ?CBC - Abnormal; Notable for the following components:  ? RBC 3.36 (*)   ? Hemoglobin 7.6 (*)   ? HCT 25.4 (*)   ? MCV 75.6 (*)   ? MCH 22.6 (*)   ? MCHC 29.9 (*)   ? RDW 17.2 (*)   ? Platelets 419 (*)   ? All other components within normal limits  ?URINALYSIS, ROUTINE W REFLEX MICROSCOPIC  ? ? ?EKG ?None ? ?Radiology ?No results found. ? ?Procedures ?Procedures  ? ? ?Medications Ordered in ED ?Medications  ?HYDROmorphone (DILAUDID) injection 1 mg (1 mg Intravenous Given 01/04/22 0809)  ?prochlorperazine (COMPAZINE) injection 10 mg (10 mg Intravenous Given 01/04/22 0809)  ? ? ?ED Course/ Medical Decision Making/ A&P ?Clinical Course as of 01/04/22 1031  ?Wynelle LinkSun Jan 04, 2022  ?[id number] Discussed the patient's anemia with her.  She does have a longstanding history of anemia, seems of a baseline hemoglobin around 8.  This is somewhat unusual as she does not have an active menses.  She is not taking iron.  No need to start on the iron at this time, but I also discussed with her the importance of following up on this issue.  She is on Xarelto which is put her at a higher risk of bleeding.  I do think she is stable at the moment and not requiring a blood transfusion, but advised she will need to follow-up with her PCP for this.  I can also offer hematology office number if she has not establish care with one for her blood clots. [MT]  ?0848 Her pain is significantly improved with the Dilaudid.  She is undergoing ultrasound now [MT]  ?0923 Ultrasound was limited by bowels, could not adequately visualize ovaries.  Patient reassessed and pain remains significantly improved.  My clinical suspicion for ovarian torsion  is lower at this time.  I did review her medical chart from care everywhere, and I do see a significant number of visits to urgent care in the emergency department for pelvic pain; this is more likely a recurring issue, possibly related to her ovarian cyst.  If her pain is controlled here I do think would be reasonably safe for her to follow-up with a gynecologist, and started on iron at home. [MT]  ?  ?Clinical Course User Index ?[MT]  Terald Sleeper, MD  ? ?                        ?Medical Decision Making ?Amount and/or Complexity of Data Reviewed ?Labs: ordered. ?Radiology: ordered. ? ?Risk ?OTC drugs. ?Prescription drug management. ? ? ?Differential diagnosis include ruptured ovarian cyst versus ovarian torsion versus ovarian abscess versus other ? ?IV pain medications ordered ? ?Pelvic ultrasound ordered and personally reviewed, showing limited view of the ovaries due to bowel gas pattern. ? ?* ? ?Subsequent review of care everywhere records show recurring abdominal imaging, including CT of the abdomen 2 weeks ago at [facility], followed by another CT in February 2023, both of which showed no significant findings in the abdomen or pelvis.  I do not think she needs another CT scan at this time.   ? ?* ? ?On my further review of her medical chart, I do have some concerns about the high number of narcotic prescriptions that have been prescribed to her. ? ?PDMP reviewed showing 36 prescribes of narcotics.  Care everywhere showing multiple UC/ED visits for chronic pelvic pain, abdominal pain, back pain, dental pain.  We will not be providing further narcotics in the ED or home. ? ? 12/27/2021  12/27/2021   1  Oxycodone-Acetaminophen 5-325 8.00  2  Co Fle  [id number]   [facility] ([id number])  0/0  30.00 MME  Medicaid  [place]   ? 12/23/2021  11/12/2021   1  Zolpidem Tartrate 10 Mg Tablet 14.00  14  Collene Mares  [id number]   [facility] ([id number])  0/5  0.50 LME  Medicaid  [place]   ? 12/19/2021  12/19/2021   1  Hydrocodone-Acetamin 5-325 Mg 10.00  2  Da Gli   [id number]   [place] ([id number])  0/0  25.00 MME  Medicaid  [place]   ? 12/16/2021  12/16/2021   1  Lorazepam 0.5 Mg Tablet 60.00  30  Af Kpa  [id number]   Wal (9302)  0/0  1.00 LME  Medicaid  [place]   ? 12/03/2021  10/08/2021   1  Zolpide

## 2022-01-04 NOTE — ED Notes (Signed)
First contact with patient. Pt arrived from home with c/o LLQ pain. Pt. denies shob, is A&OX 4, resp. even/unlabored. Pt placed into gown ,call light within reach. Patient updated on plan of care. Will continue to monitor patient.   ?

## 2022-01-04 NOTE — ED Triage Notes (Signed)
Pt arrives pov, steady gait to room, c/o LLQ pain starting last night. Endorses nausea ?

## 2022-01-04 NOTE — ED Notes (Signed)
ED Provider at bedside. 

## 2022-01-04 NOTE — ED Notes (Signed)
Patient transported to CT 

## 2022-01-04 NOTE — ED Notes (Signed)
Spoke with EDP Trifan regarding pt b/p, will reassess for fluids if map drops  ?

## 2022-01-04 NOTE — Discharge Instructions (Addendum)
Please follow-up with your primary care physician or hematologist for your iron deficiency anemia and your pulmonary embolisms.  I provided you an office number for the on-call hematologist he can make an appointment with them.  You can also follow-up with an OB doctor that you see already for your pelvic pain and cyst. ? ?You have anemia, and should be taking iron for this.  Iron can cause constipation and so I prescribed a stool softener with this as well. ?

## 2022-01-05 ENCOUNTER — Other Ambulatory Visit: Payer: Self-pay

## 2022-01-05 ENCOUNTER — Emergency Department (HOSPITAL_BASED_OUTPATIENT_CLINIC_OR_DEPARTMENT_OTHER)
Admission: EM | Admit: 2022-01-05 | Discharge: 2022-01-05 | Disposition: A | Discharge status: Home / Self Care | Payer: Medicaid Other | Attending: Emergency Medicine | Admitting: Emergency Medicine

## 2022-01-05 ENCOUNTER — Encounter (HOSPITAL_BASED_OUTPATIENT_CLINIC_OR_DEPARTMENT_OTHER): Payer: Self-pay

## 2022-01-05 DIAGNOSIS — K0889 Other specified disorders of teeth and supporting structures: Secondary | ICD-10-CM | POA: Diagnosis present

## 2022-01-05 DIAGNOSIS — R109 Unspecified abdominal pain: Secondary | ICD-10-CM | POA: Insufficient documentation

## 2022-01-05 MED ORDER — OXYCODONE-ACETAMINOPHEN 5-325 MG PO TABS
1.0000 | ORAL_TABLET | Freq: Once | ORAL | Status: AC
  Administered 2022-01-05: 1 via ORAL
  Filled 2022-01-05: qty 1

## 2022-01-05 MED ORDER — KETOROLAC TROMETHAMINE 15 MG/ML IJ SOLN
15.0000 mg | Freq: Once | INTRAMUSCULAR | Status: AC
Start: 2022-01-05 — End: 2022-01-05
  Administered 2022-01-05: 15 mg via INTRAMUSCULAR
  Filled 2022-01-05: qty 1

## 2022-01-05 NOTE — Discharge Instructions (Signed)
Follow-up with dentistry may continue take your home medication ?

## 2022-01-05 NOTE — ED Notes (Signed)
Patient states oral surgery removed upper left canine today. Patient states tramadol is not working. Patient states desire to be "renumbed". ?

## 2022-01-05 NOTE — ED Provider Notes (Signed)
?[facility] EMERGENCY DEPARTMENT ?Provider Note ? ? ?CSN: [id number] ?Arrival date & time: 01/05/22  1734 ? ?  ? ?History ? ?Chief Complaint  ?Patient presents with  ? Dental Pain  ? ? ?Gabrielle Rose is a 36 y.o. female for evaluation of dental pain.  Had front left tooth removed earlier today.  Was prescribed tramadol by dentistry.  She continues to have pain.  States she called her dentist which she told her to "suck it up."  She comes in today due to persistent pain.  No fever, facial swelling, bleeding, redness, warmth.  Taking tramadol without relief. On chronic anticoagulation.  ? ?Of note patient has extensive history of 37 prescriptions in the narcotic database for short-term narcotic rx.  Was seen yesterday here in the ED for abdominal pain.  She is seen quite frequently for chronic pain, dental pain, abdominal pain. ? ?Patient with 18 visits within the last 30 days for chronic pain at ED or UC reviewed in Epic. ? ?HPI ? ?  ? ?Home Medications ?Prior to Admission medications   ?Medication Sig Start Date End Date Taking? Authorizing Provider  ?albuterol (PROVENTIL) (2.5 MG/3ML) 0.083% nebulizer solution Take 2.5 mg by nebulization every 4 (four) hours as needed for wheezing or shortness of breath. 10/19/21   [provider]  ?ALPRAZolam Prudy Feeler) 1 MG tablet Take 1 mg by mouth at bedtime as needed for anxiety.    [provider]  ?clindamycin (CLEOCIN) 150 MG capsule Take 3 capsules (450 mg total) by mouth 3 (three) times daily. 12/27/21   Teressa Lower, PA-C  ?cyclobenzaprine (FLEXERIL) 10 MG tablet Take 10 mg by mouth 3 (three) times daily. 12/24/21   [provider]  ?docusate sodium (COLACE) 100 MG capsule Take 1 capsule (100 mg total) by mouth every 12 (twelve) hours. 01/04/22   Terald Sleeper, MD  ?escitalopram (LEXAPRO) 10 MG tablet Take 10 mg by mouth daily.    [provider]  ?ferrous sulfate 325 (65 FE) MG tablet Take 1 tablet (325 mg total) by  mouth daily for 30 doses. 01/04/22 02/03/22  Terald Sleeper, MD  ?gabapentin (NEURONTIN) 300 MG capsule Take 300 mg by mouth 2 (two) times daily.    [provider]  ?HYDROcodone-acetaminophen (NORCO) 5-325 MG tablet Take 1 tablet by mouth every 4 (four) hours as needed for moderate pain. 12/19/21   Dione Booze, MD  ?hydrOXYzine (ATARAX) 10 MG tablet Take 10 mg by mouth every 6 (six) hours as needed. 12/31/21   [provider]  ?LORazepam (ATIVAN) 1 MG tablet Take 1 mg by mouth 2 (two) times daily. 01/01/22   [provider]  ?methocarbamol (ROBAXIN) 500 MG tablet Take 1 tablet (500 mg total) by mouth 2 (two) times daily. 12/07/21   Redwine, Madison A, PA-C  ?metoCLOPramide (REGLAN) 10 MG tablet Take 1 tablet (10 mg total) by mouth every 6 (six) hours. 12/19/21   Dione Booze, MD  ?metroNIDAZOLE (FLAGYL) 500 MG tablet Take 500 mg by mouth 2 (two) times daily. 01/01/22   [provider]  ?oxyCODONE-acetaminophen (PERCOCET/ROXICET) 5-325 MG tablet Take 1 tablet by mouth every 6 (six) hours as needed for severe pain. 12/27/21   Honor Loh M, PA-C  ?pantoprazole (PROTONIX) 40 MG tablet Take 40 mg by mouth daily.    [provider]  ?QUEtiapine (SEROQUEL) 25 MG tablet Take 25 mg by mouth at bedtime.    [provider]  ?rivaroxaban (XARELTO) 20 MG TABS tablet Take  20 mg by mouth daily with supper.    [provider]  ?sertraline (ZOLOFT) 50 MG tablet Take 50 mg by mouth daily. 12/31/21   [provider]  ?topiramate (TOPAMAX) 50 MG tablet Take 50 mg by mouth 2 (two) times daily.    [provider]  ?traMADol-acetaminophen (ULTRACET) 37.5-325 MG tablet Take by mouth. 01/05/22   [provider]  ?zolpidem (AMBIEN CR) 12.5 MG CR tablet SMARTSIG:1 Tablet(s) By Mouth Every Evening 01/01/22   [provider]  ?   ? ?Allergies    ?Erythromycin, Macrobid [nitrofurantoin macrocrystal], Penicillins, Nsaids, and Zofran [ondansetron hcl]    ? ?Review of Systems   ?Review of Systems  ?Constitutional: Negative.   ?HENT:  Positive for dental problem.   ?Respiratory: Negative.    ?Cardiovascular: Negative.   ?Gastrointestinal: Negative.   ?Genitourinary: Negative.   ?Musculoskeletal: Negative.   ?Skin: Negative.   ?Neurological: Negative.   ?All other systems reviewed and are negative. ? ?Physical Exam ?Updated Vital Signs ?BP 134/69 (BP Location: Left Arm)   Pulse (!) 103   Temp 98 ?F (36.7 ?C) (Oral)   Resp (!) 22   Ht 4\' 11"  (1.499 m)   Wt 86.2 kg   SpO2 100%   BMI 38.38 kg/m?  ?Physical Exam ?Vitals and nursing note reviewed.  ?Constitutional:   ?   General: She is not in acute distress. ?   Appearance: She is well-developed. She is not ill-appearing, toxic-appearing or diaphoretic.  ?HENT:  ?   Head: Normocephalic and atraumatic.  ?   Jaw: There is normal jaw occlusion.  ?   Comments: No facial swelling, erythema or warmth ?   Mouth/Throat:  ?   Lips: Pink.  ?   Mouth: Mucous membranes are moist.  ?   Pharynx: Oropharynx is clear. Uvula midline.  ? ?   Comments: Poor dentition, multiple missing teeth.  Tooth #10 missing with sutures in place.  No active bleeding, gingival erythema or warmth.  No drooling, dysphagia or trismus.  No pooling of secretions. ?Eyes:  ?   Pupils: Pupils are equal, round, and reactive to light.  ?Cardiovascular:  ?   Rate and Rhythm: Normal rate.  ?Pulmonary:  ?   Effort: No respiratory distress.  ?Abdominal:  ?   General: There is no distension.  ?Musculoskeletal:     ?   General: Normal range of motion.  ?   Cervical back: Normal range of motion.  ?Skin: ?   General: Skin is warm and dry.  ?Neurological:  ?   General: No focal deficit present.  ?   Mental Status: She is alert.  ?Psychiatric:     ?   Mood and Affect: Mood normal.  ? ?ED Results / Procedures / Treatments   ?Labs ?(all labs ordered are listed, but only abnormal results are displayed) ?Labs Reviewed - No data to display ? ?EKG ?None ? ?Radiology ?  Transvaginal Non-OB ? ?Result Date: 01/04/2022 ?CLINICAL DATA:  History of PCOS. Partial hysterectomy. Acute left-sided pelvic pain last night. Evaluate for torsion. EXAM: ULTRASOUND PELVIS TRANSVAGINAL TECHNIQUE: Transvaginal ultrasound examination of the pelvis was performed including evaluation of the uterus, ovaries, adnexal regions, and pelvic cul-de-sac. COMPARISON:  CT scan of the abdomen and pelvis December 18, 2021 FINDINGS: Uterus Surgically absent. Right ovary Not visualized. Left ovary Not visualized. Other findings: No free fluid in the pelvis. Multiple peristalsing loops of small bowel are identified in the pelvis. IMPRESSION: 1. The uterus is surgically absent.  2. Neither ovary is visualized on today's study. Electronically Signed   By: Gerome Samavid  Williams III M.D.   On: 01/04/2022 10:38   ? ?Procedures ?Procedures  ? ? ?Medications Ordered in ED ?Medications  ?oxyCODONE-acetaminophen (PERCOCET/ROXICET) 5-325 MG per tablet 1 tablet (1 tablet Oral Given 01/05/22 2021)  ?ketorolac (TORADOL) 15 MG/ML injection 15 mg (15 mg Intramuscular Given 01/05/22 2022)  ? ? ?ED Course/ Medical Decision Making/ A&P ?  ? ?36 year old here for evaluation of dental pain.  Apparently had oral surgery today remove tooth #10.  Was prescribed tramadol per patient.  Currently she called her dentist who would not prescribe her anything stronger.  Of note patient does have extensive number of narcotic prescriptions in the PMP database, she is seen quite frequently for chronic abdominal pain, chronic dental pain as well as chronic myalgias.  Does have obviously missing tooth with sutures in place however no erythema, bleeding or drainage.  She is no drooling, dysphagia or trismus.  She has no respiratory distress.  She has no facial swelling, redness or warmth to suggest complication from her recent dental procedure.  I offered patient a single dose of pain medicine here which she is agreeable with.  I discussed with her I would not be  prescribing outpatient narcotics and she should continue to follow-up with dentistry.  She is agreeable. ? ?The patient has been appropriately medically screened and/or stabilized in the ED. I have low suspicion for an

## 2022-01-05 NOTE — ED Triage Notes (Signed)
Pt arrives ambulatory to ED with c/o pain after having oral surgery today states that the medication they gave her is not working. Pt had extraction of left upper tooth. Pt was sent home with tramadol.  ?

## 2022-01-06 ENCOUNTER — Telehealth: Payer: Self-pay | Admitting: Hematology and Oncology

## 2022-01-06 NOTE — Telephone Encounter (Signed)
Called pt to sch appt per 4/3 staff msg from Dr. Leonides Schanz. No answer. Left msg for pt to call back to sch appt.  ?

## 2022-01-07 ENCOUNTER — Telehealth: Payer: Self-pay | Admitting: Hematology and Oncology

## 2022-01-07 NOTE — Telephone Encounter (Signed)
Called pt to sch appt per 4/3 staff msg from Dr. Dorsey. No answer. Left msg for pt to call back to sch appt.  ?

## 2022-01-09 ENCOUNTER — Telehealth: Payer: Self-pay | Admitting: Hematology and Oncology

## 2022-01-09 NOTE — Telephone Encounter (Signed)
Called pt to sch appt per 4/3 staff msg from Dr. Lorenso Courier. No answer and vm was full ?

## 2022-03-15 ENCOUNTER — Emergency Department (HOSPITAL_COMMUNITY)
Admission: EM | Admit: 2022-03-15 | Discharge: 2022-03-15 | Discharge status: Against Medical Advice | Payer: Medicaid Other | Attending: Emergency Medicine | Admitting: Emergency Medicine

## 2022-03-15 ENCOUNTER — Other Ambulatory Visit: Payer: Self-pay

## 2022-03-15 ENCOUNTER — Encounter (HOSPITAL_COMMUNITY): Payer: Self-pay

## 2022-03-15 DIAGNOSIS — R197 Diarrhea, unspecified: Secondary | ICD-10-CM | POA: Insufficient documentation

## 2022-03-15 DIAGNOSIS — Z765 Malingerer [conscious simulation]: Secondary | ICD-10-CM

## 2022-03-15 DIAGNOSIS — R102 Pelvic and perineal pain: Secondary | ICD-10-CM | POA: Insufficient documentation

## 2022-03-15 DIAGNOSIS — Z7289 Other problems related to lifestyle: Secondary | ICD-10-CM | POA: Diagnosis not present

## 2022-03-15 DIAGNOSIS — R1030 Lower abdominal pain, unspecified: Secondary | ICD-10-CM | POA: Insufficient documentation

## 2022-03-15 DIAGNOSIS — Z7901 Long term (current) use of anticoagulants: Secondary | ICD-10-CM | POA: Diagnosis not present

## 2022-03-15 DIAGNOSIS — R110 Nausea: Secondary | ICD-10-CM | POA: Insufficient documentation

## 2022-03-15 LAB — URINALYSIS, ROUTINE W REFLEX MICROSCOPIC
Bilirubin Urine: NEGATIVE
Glucose, UA: NEGATIVE mg/dL
Hgb urine dipstick: NEGATIVE
Ketones, ur: NEGATIVE mg/dL
Nitrite: NEGATIVE
Protein, ur: NEGATIVE mg/dL
Specific Gravity, Urine: 1.009 (ref 1.005–1.030)
pH: 5 (ref 5.0–8.0)

## 2022-03-15 LAB — CBC WITH DIFFERENTIAL/PLATELET
Abs Immature Granulocytes: 0.02 10*3/uL (ref 0.00–0.07)
Basophils Absolute: 0.1 10*3/uL (ref 0.0–0.1)
Basophils Relative: 1 %
Eosinophils Absolute: 0.6 10*3/uL — ABNORMAL HIGH (ref 0.0–0.5)
Eosinophils Relative: 6 %
HCT: 27.3 % — ABNORMAL LOW (ref 36.0–46.0)
Hemoglobin: 7.6 g/dL — ABNORMAL LOW (ref 12.0–15.0)
Immature Granulocytes: 0 %
Lymphocytes Relative: 21 %
Lymphs Abs: 2 10*3/uL (ref 0.7–4.0)
MCH: 21.8 pg — ABNORMAL LOW (ref 26.0–34.0)
MCHC: 27.8 g/dL — ABNORMAL LOW (ref 30.0–36.0)
MCV: 78.4 fL — ABNORMAL LOW (ref 80.0–100.0)
Monocytes Absolute: 0.4 10*3/uL (ref 0.1–1.0)
Monocytes Relative: 4 %
Neutro Abs: 6.5 10*3/uL (ref 1.7–7.7)
Neutrophils Relative %: 68 %
Platelets: 449 10*3/uL — ABNORMAL HIGH (ref 150–400)
RBC: 3.48 MIL/uL — ABNORMAL LOW (ref 3.87–5.11)
RDW: 17.5 % — ABNORMAL HIGH (ref 11.5–15.5)
WBC: 9.6 10*3/uL (ref 4.0–10.5)
nRBC: 0 % (ref 0.0–0.2)

## 2022-03-15 LAB — COMPREHENSIVE METABOLIC PANEL
ALT: 12 U/L (ref 0–44)
AST: 17 U/L (ref 15–41)
Albumin: 4 g/dL (ref 3.5–5.0)
Alkaline Phosphatase: 68 U/L (ref 38–126)
Anion gap: 9 (ref 5–15)
BUN: 12 mg/dL (ref 6–20)
CO2: 20 mmol/L — ABNORMAL LOW (ref 22–32)
Calcium: 8.9 mg/dL (ref 8.9–10.3)
Chloride: 109 mmol/L (ref 98–111)
Creatinine, Ser: 0.67 mg/dL (ref 0.44–1.00)
GFR, Estimated: >60 mL/min (ref >60)
Glucose, Bld: 91 mg/dL (ref 70–99)
Potassium: 4.1 mmol/L (ref 3.5–5.1)
Sodium: 138 mmol/L (ref 135–145)
Total Bilirubin: 0.5 mg/dL (ref 0.3–1.2)
Total Protein: 6.7 g/dL (ref 6.5–8.1)

## 2022-03-15 LAB — LIPASE, BLOOD: Lipase: 33 U/L (ref 11–51)

## 2022-03-15 LAB — I-STAT BETA HCG BLOOD, ED (MC, WL, AP ONLY): I-stat hCG, quantitative: <5 m[IU]/mL (ref <5)

## 2022-03-15 MED ORDER — ACETAMINOPHEN 325 MG PO TABS: 650.0000 mg | ORAL_TABLET | Freq: Once | ORAL | Status: DC

## 2022-03-15 NOTE — ED Provider Notes (Signed)
[facility] EMERGENCY DEPARTMENT Provider Note   CSN: [id number] Arrival date & time: 03/15/22  1345     History  No chief complaint on file.   Gabrielle Rose is a 36 y.o. female.  HPI Patient presenting for lower abdominal pain, associated pelvic pain, for 1 week that comes and goes.  She has diarrhea and nausea but no vomiting.  She denies fever, chills, change in urine habits.  She has previously had a hysterectomy but still has her ovaries.  She lives in [place] and decided to come here for evaluation.  She was seen here in the ED, 01/04/2022 and at that time had a negative pelvic ultrasound.  She also was seen in March 2023 with a negative CT of the abdomen pelvis.  She has had numerous visits to multiple different ED's in the last several months.  She typically has painful conditions.  She has been previously referred to hematology for anemia but has not gone.  She has been prescribed iron but does not take it because it upsets her stomach.    Home Medications Prior to Admission medications   Medication Sig Start Date End Date Taking? Authorizing Provider  albuterol (PROVENTIL) (2.5 MG/3ML) 0.083% nebulizer solution Take 2.5 mg by nebulization every 4 (four) hours as needed for wheezing or shortness of breath. 10/19/21   [provider]  ALPRAZolam Prudy Feeler) 1 MG tablet Take 1 mg by mouth at bedtime as needed for anxiety.    [provider]  clindamycin (CLEOCIN) 150 MG capsule Take 3 capsules (450 mg total) by mouth 3 (three) times daily. 12/27/21   Honor Loh M, PA-C  cyclobenzaprine (FLEXERIL) 10 MG tablet Take 10 mg by mouth 3 (three) times daily. 12/24/21   [provider]  docusate sodium (COLACE) 100 MG capsule Take 1 capsule (100 mg total) by mouth every 12 (twelve) hours. 01/04/22   Terald Sleeper, MD  escitalopram (LEXAPRO) 10 MG tablet Take 10 mg by mouth daily.    [provider]  ferrous sulfate 325 (65  FE) MG tablet Take 1 tablet (325 mg total) by mouth daily for 30 doses. 01/04/22 02/03/22  Terald Sleeper, MD  gabapentin (NEURONTIN) 300 MG capsule Take 300 mg by mouth 2 (two) times daily.    [provider]  HYDROcodone-acetaminophen (NORCO) 5-325 MG tablet Take 1 tablet by mouth every 4 (four) hours as needed for moderate pain. 12/19/21   Dione Booze, MD  hydrOXYzine (ATARAX) 10 MG tablet Take 10 mg by mouth every 6 (six) hours as needed. 12/31/21   [provider]  LORazepam (ATIVAN) 1 MG tablet Take 1 mg by mouth 2 (two) times daily. 01/01/22   [provider]  methocarbamol (ROBAXIN) 500 MG tablet Take 1 tablet (500 mg total) by mouth 2 (two) times daily. 12/07/21   Redwine, Madison A, PA-C  metoCLOPramide (REGLAN) 10 MG tablet Take 1 tablet (10 mg total) by mouth every 6 (six) hours. 12/19/21   Dione Booze, MD  metroNIDAZOLE (FLAGYL) 500 MG tablet Take 500 mg by mouth 2 (two) times daily. 01/01/22   [provider]  oxyCODONE-acetaminophen (PERCOCET/ROXICET) 5-325 MG tablet Take 1 tablet by mouth every 6 (six) hours as needed for severe pain. 12/27/21   Honor Loh M, PA-C  pantoprazole (PROTONIX) 40 MG tablet Take 40 mg by mouth daily.    [provider]  QUEtiapine (SEROQUEL) 25 MG tablet Take 25 mg by mouth at bedtime.    [provider]  rivaroxaban (XARELTO) 20 MG TABS tablet Take 20 mg by mouth daily with supper.    [provider]  sertraline (ZOLOFT) 50 MG tablet Take 50 mg by mouth daily. 12/31/21   [provider]  topiramate (TOPAMAX) 50 MG tablet Take 50 mg by mouth 2 (two) times daily.    [provider]  traMADol-acetaminophen (ULTRACET) 37.5-325 MG tablet Take by mouth. 01/05/22   [provider]  zolpidem (AMBIEN CR) 12.5 MG CR tablet SMARTSIG:1 Tablet(s) By Mouth Every Evening 01/01/22   [provider]      Allergies    Erythromycin, Macrobid [nitrofurantoin macrocrystal],  Penicillins, Nsaids, and Zofran [ondansetron hcl]    Review of Systems   Review of Systems  Physical Exam Updated Vital Signs BP 122/75   Pulse 86   Temp 98.2 F (36.8 C) (Oral)   Resp 16   SpO2 100%  Physical Exam Vitals and nursing note reviewed.  Constitutional:      General: She is not in acute distress.    Appearance: She is well-developed. She is obese. She is not ill-appearing, toxic-appearing or diaphoretic.  HENT:     Head: Normocephalic and atraumatic.     Right Ear: External ear normal.     Left Ear: External ear normal.  Eyes:     Conjunctiva/sclera: Conjunctivae normal.     Pupils: Pupils are equal, round, and reactive to light.  Neck:     Trachea: Phonation normal.  Cardiovascular:     Rate and Rhythm: Normal rate and regular rhythm.     Heart sounds: Normal heart sounds.  Pulmonary:     Effort: Pulmonary effort is normal.     Breath sounds: Normal breath sounds.  Abdominal:     General: There is no distension.     Palpations: Abdomen is soft.     Tenderness: There is abdominal tenderness (Suprapubic, mild).  Musculoskeletal:        General: Normal range of motion.     Cervical back: Normal range of motion and neck supple.  Skin:    General: Skin is warm and dry.  Neurological:     Mental Status: She is alert and oriented to person, place, and time.     Cranial Nerves: No cranial nerve deficit.     Sensory: No sensory deficit.     Motor: No abnormal muscle tone.     Coordination: Coordination normal.  Psychiatric:        Mood and Affect: Mood normal.        Behavior: Behavior normal.        Thought Content: Thought content normal.        Judgment: Judgment normal.     ED Results / Procedures / Treatments   Labs (all labs ordered are listed, but only abnormal results are displayed) Labs Reviewed  CBC WITH DIFFERENTIAL/PLATELET - Abnormal; Notable for the following components:      Result Value   RBC 3.48 (*)    Hemoglobin 7.6 (*)    HCT  27.3 (*)    MCV 78.4 (*)    MCH 21.8 (*)    MCHC 27.8 (*)    RDW 17.5 (*)    Platelets 449 (*)    Eosinophils Absolute 0.6 (*)    All other components within normal limits  COMPREHENSIVE METABOLIC PANEL - Abnormal; Notable for the following components:   CO2 20 (*)    All other components within normal limits  LIPASE, BLOOD  URINALYSIS, ROUTINE W REFLEX MICROSCOPIC  I-STAT BETA HCG BLOOD, ED ([facility], [facility], [facility] ONLY)    EKG None  Radiology No results found.  Procedures Procedures    Medications Ordered in ED Medications  acetaminophen (TYLENOL) tablet 650 mg (has no administration in time range)    ED Course/ Medical Decision Making/ A&P Clinical Course as of 03/15/22 1928  Wynelle Link Mar 15, 2022  [birth year]27 Patient told the nurse that she was leaving because the Tylenol that she was given, "did not help." [EW]    Clinical Course User Index [EW] Mancel Bale, MD                           Medical Decision Making Patient presenting with lower abdominal/suprapubic pain, onset 1 week ago and intermittent.  She is worried about her ovarian cyst.  Previously evaluated this year in the [facility] health emergency departments, with negative pelvic ultrasound and abdominal/pelvic CT.  She has numerous visits to and emergency department in [facility].  She told me today that she "does not like to go there."  Problems Addressed: Pelvic pain: acute illness or injury    Details: Waxing and waning, ongoing and recurrent  Amount and/or Complexity of Data Reviewed Independent Historian:     Details: Cogent historian External Data Reviewed: notes.    Details: Various emergency department visits, [facility] and [facility] health emergency department, over the last 6-months.  Typically these are for painful conditions.  During this time she has had 4 separate CT imaging test done which were negative.  She had previously been diagnosed with drug-seeking behavior.  She has been  evaluated and eloped prior to completing treatment.  Risk OTC drugs. Decision regarding hospitalization. Risk Details: Patient presenting with nonspecific symptoms and reassuring physical exam and recent evidence for imaging that does not require to be repeated today.  She has strong findings for drug-seeking behavior including specifically asking for pain medicine in the ED.  Signs are normal.  Her evaluation in the ED today is reassuring.  I doubt acute pelvic disorder, urinary tract infection, intestinal disorders or other acute intra-abdominal processes.  She was going to be instructed to use routine OTC medicines and follow-up with a primary care doctor for ongoing management.  The patient chose to leave AMA, because the Tylenol did not help, prior to return of her urine specimen as was previously agreed on.  This solidifies her drug-seeking behavior as her primary motivator for being here.           Final Clinical Impression(s) / ED Diagnoses Final diagnoses:  Pelvic pain  Drug-seeking behavior    Rx / DC Orders ED Discharge Orders     None         Mancel Bale, MD 03/15/22 1951

## 2022-03-15 NOTE — ED Notes (Signed)
Pt requesting to leave AMA.md aware.. refused DC vital signs

## 2022-03-15 NOTE — ED Provider Triage Note (Signed)
Emergency Medicine Provider Triage Evaluation Note  Gabrielle Rose , a 36 y.o. female  was evaluated in triage.  Pt complains of pelvic pain all over last week.  Patient reports taking Motrin, Goody powders, Tylenol and has not had improvement in symptoms.  She has not seen an OB/GYN.  Did have a hysterectomy.  Review of Systems  Positive: Pelvic pain, diarrhea  Negative: Urinary symptoms, vaginal discharge.  Physical Exam  There were no vitals taken for this visit. Gen:   Awake, no distress   Resp:  Normal effort  MSK:   Moves extremities without difficulty  Other:    Medical Decision Making  Medically screening exam initiated at 2:11 PM.  Appropriate orders placed.  Gabrielle Rose was informed that the remainder of the evaluation will be completed by another provider, this initial triage assessment does not replace that evaluation, and the importance of remaining in the ED until their evaluation is complete.  Please see note from prior provider for the same complaint 9 days ago:  The differential diagnosis associated with the patient's presentation includes: but not limited to, peptic ulcer disease, obstruction, biliary disease, pancreatitis, cholecystitis, colitis, gastroenteritis, diverticulitis.  With nursing in with chaperone while talking to the pt, she was advised that ct scan and labs and urine would be ordered. Discussed her visit history and the number of ct scans ordered in the past and her increased risk of cancer with continued visits in the future, she was advised that if she comes to the ed with abdominal pain we have to rule out pathology and that requires ct scan, increased radiation puts her longterm health at risk. Offered ct scans and u/s to r/o pathology and pt asked for pain medications. She was offered toradol and if pathology is found then we can order narcotics but currently not indicated, i reviewed her ct scan from 5 days ago and no ovarian pathology  was seen.  5:21 PM When pt found out she wasnt getting narcotics or pain medications she said that she wanted to go home, with h/o frequent visits, seems like her interest to find out what was wrong with her was gone when found out she wasnt getting pain meds unless something indicated to be treated with narcotics was found .   Claude Manges, PA-C 03/15/22 1429

## 2022-03-15 NOTE — ED Triage Notes (Signed)
Patient complains of 1 week of pelvic pain with some nausea and dysuria. Denis discharge, hx of endometriosis.

## 2023-06-17 IMAGING — MR MR LUMBAR SPINE WO/W CM
4 of 7 series · 24 of 48 positions shown · IV contrast (gadavist)
Comparison: None.

CLINICAL DATA: Low back pain, cauda equina syndrome suspected

EXAM:
MRI LUMBAR SPINE WITHOUT AND WITH CONTRAST
TECHNIQUE: Multiplanar and multiecho pulse sequences of the lumbar spine were
obtained without and with intravenous contrast.
CONTRAST:  7mL GADAVIST GADOBUTROL 1 MMOL/ML IV SOLN

[Series 5: T2 · sagittal · 4.0mm · 0.73mm/px · 4 of 16 slices shown (1 of 2)]
[im 1/16]
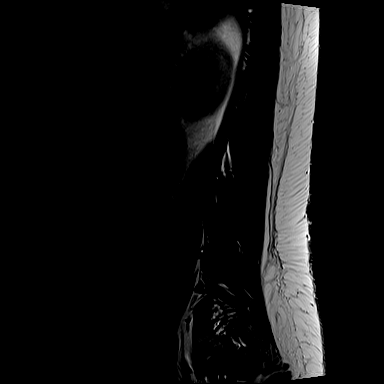
[im 6/16]
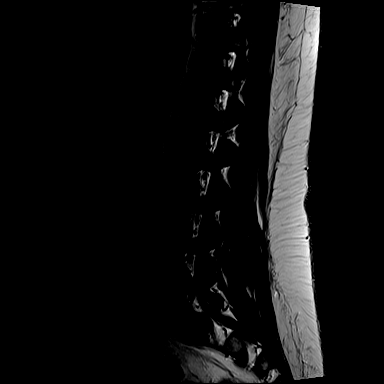
[im 11/16]
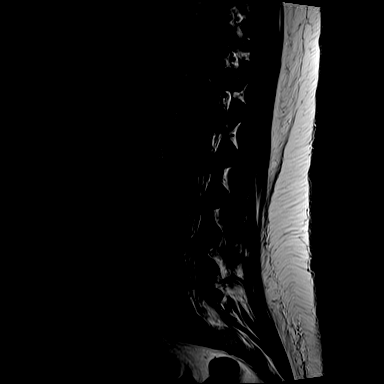
[im 16/16]
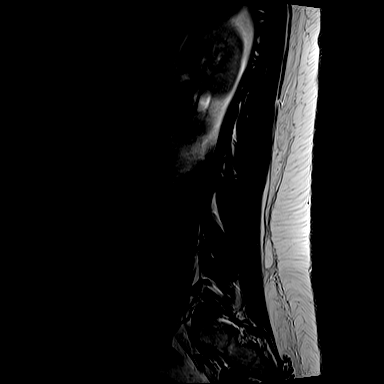

[Series 7: T1 · sagittal · 4.0mm · 0.88mm/px · 5 of 16 slices shown (1 of 2)]
[im 1/16]
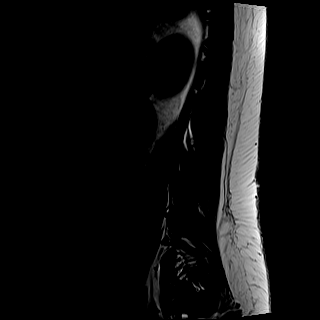
[im 4/16]
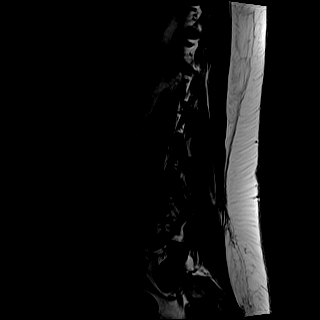
[im 8/16]
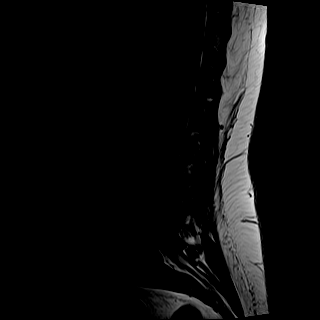
[im 12/16]
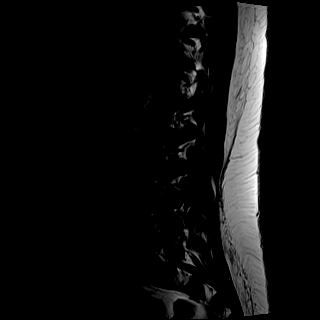
[im 16/16]
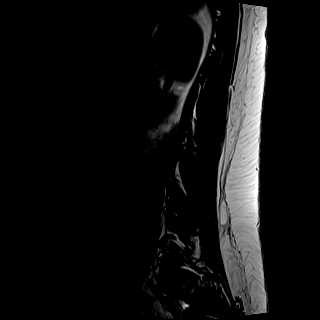

[Series 8: T2 · axial · 4.0mm · 0.57mm/px · z∈[-28,+171]mm · 8 of 34 slices shown (2 of 2)]
[im 1/34]
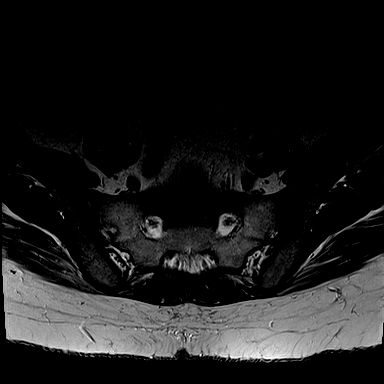
[im 4/34]
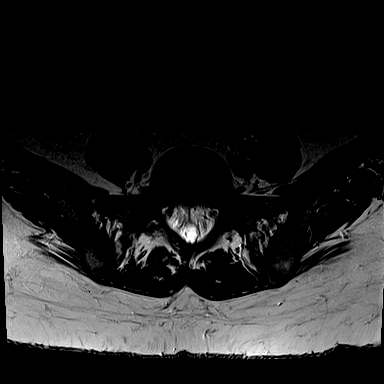
[im 12/34]
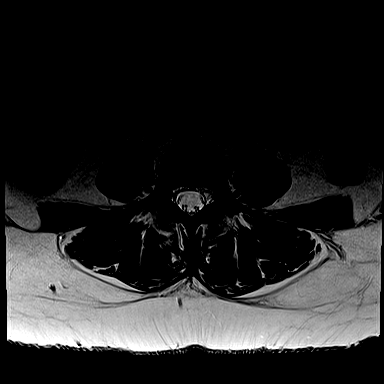
[im 15/34]
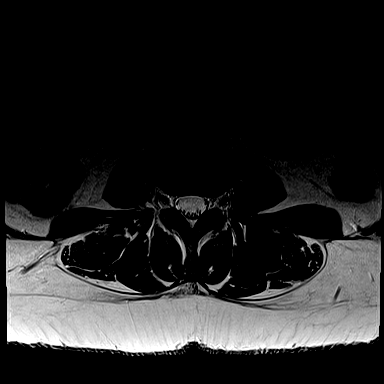
[im 19/34]
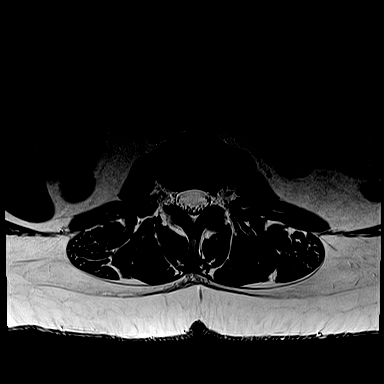
[im 23/34]
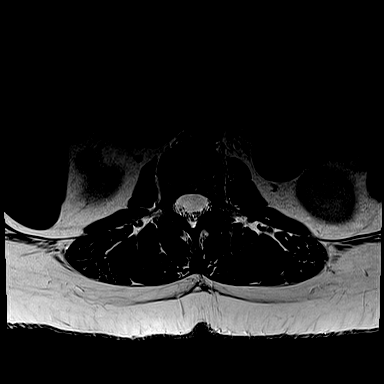
[im 30/34]
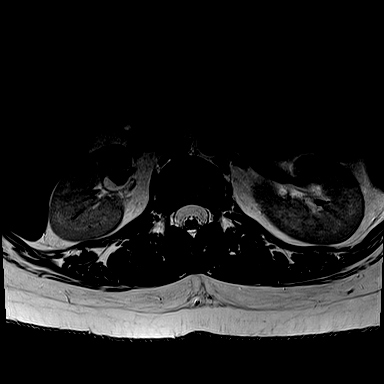
[im 34/34]
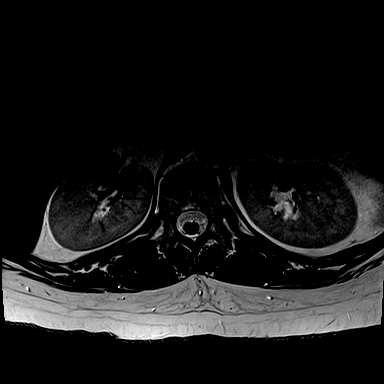

[Series 9: T1 · axial · 4.0mm · 0.34mm/px · z∈[-28,+151]mm · 7 of 34 slices shown (2 of 2)]
[im 1/34]
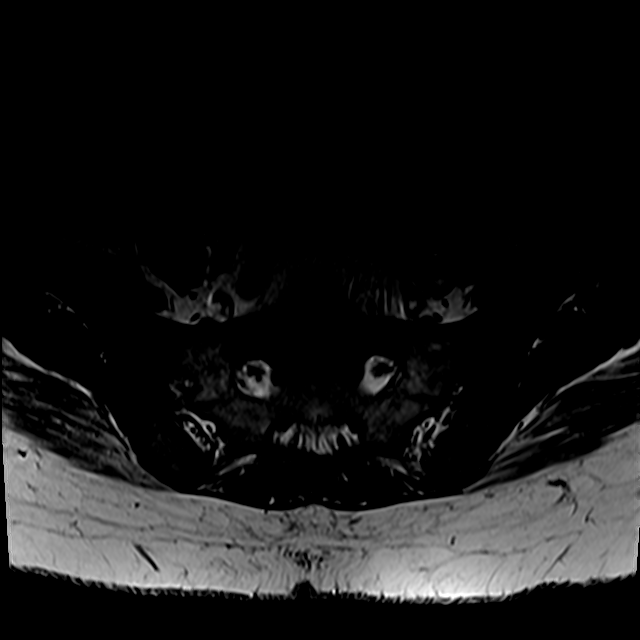
[im 4/34]
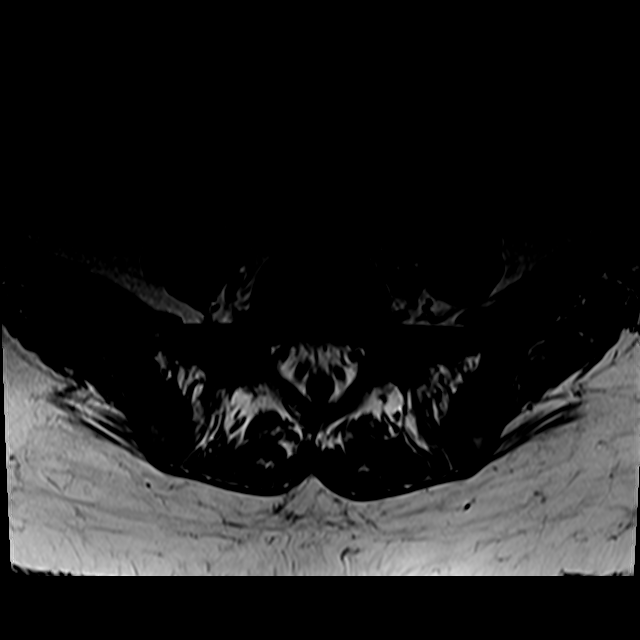
[im 12/34]
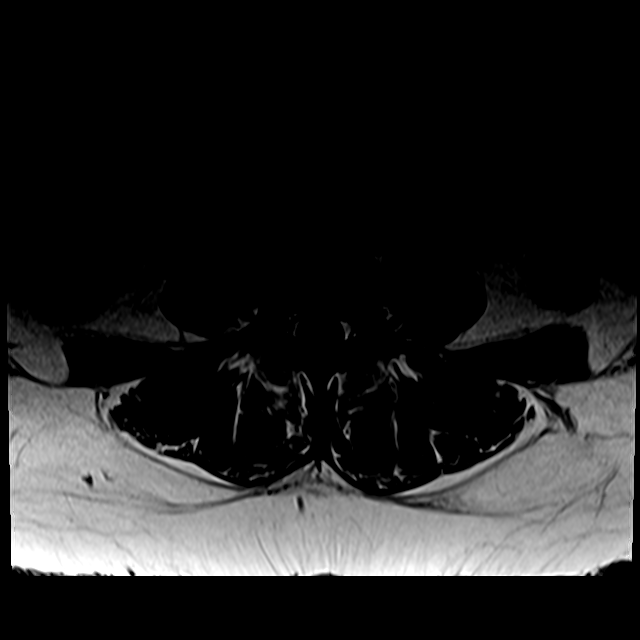
[im 15/34]
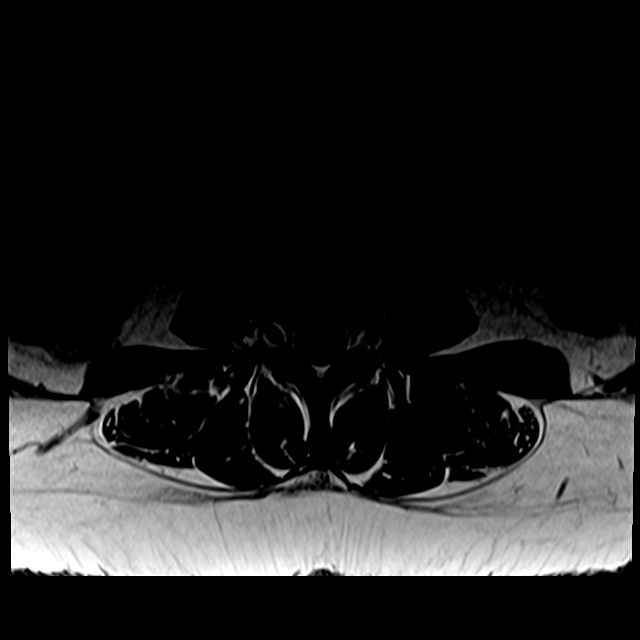
[im 19/34]
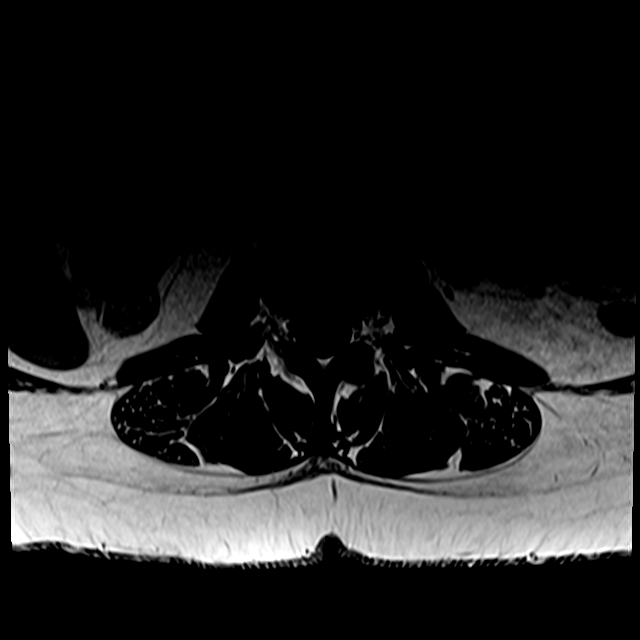
[im 23/34]
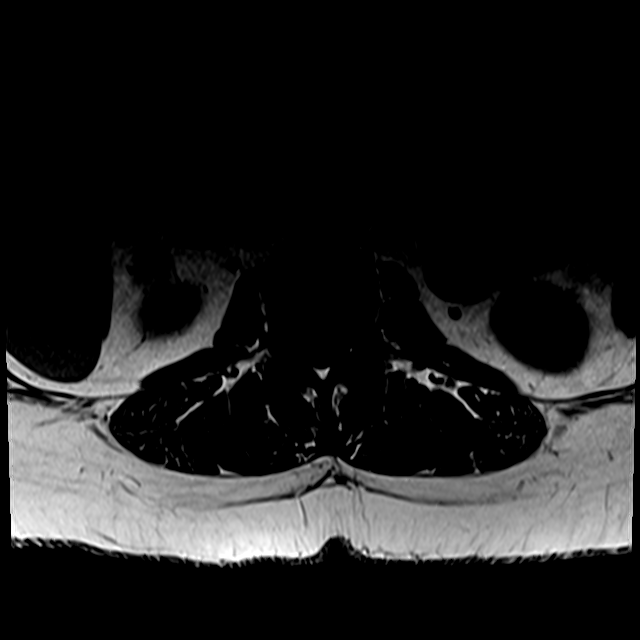
[im 30/34]
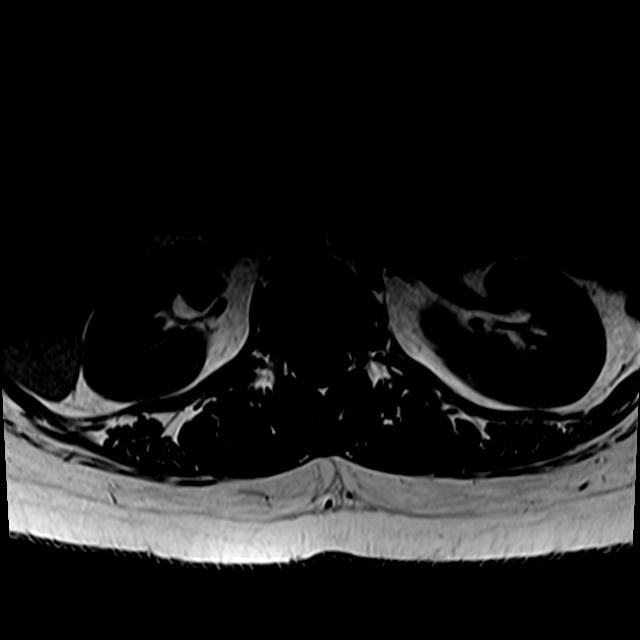

[24 of 48 positions shown; findings below may reference images not displayed]

FINDINGS: Segmentation:  Standard.

Alignment:  Physiologic.

Vertebrae: No acute fracture, evidence of discitis, or aggressive
bone lesion.

Conus medullaris and cauda equina: Conus extends to the L1 level.
Conus and cauda equina appear normal.

Paraspinal and other soft tissues: No acute paraspinal abnormality.

Disc levels:

Disc spaces: Disc spaces are preserved.

T12-L1: No significant disc bulge. No neural foraminal stenosis. No
central canal stenosis.

L1-L2: No significant disc bulge. No neural foraminal stenosis. No
central canal stenosis.

L2-L3: No significant disc bulge. No neural foraminal stenosis. No
central canal stenosis.

L3-L4: No significant disc bulge. No neural foraminal stenosis. No
central canal stenosis.

L4-L5: No significant disc bulge. No neural foraminal stenosis. No
central canal stenosis. Mild bilateral facet arthropathy at L4-5.

L5-S1: No significant disc bulge. No neural foraminal stenosis. No
central canal stenosis.
IMPRESSION: 1. No significant lumbar spine disc protrusion, foraminal stenosis
or central stenosis. No evidence of cauda equina syndrome.
2.  No acute osseous injury of the lumbar spine.

## 2023-06-28 IMAGING — CT CT ABD-PELV W/ CM
2 of 4 series · 16 of 46 positions shown, 18 images · IV contrast (agent unspecified)
Comparison: February 13, 2010

CLINICAL DATA: Right lower quadrant pain.

EXAM:
CT ABDOMEN AND PELVIS WITH CONTRAST
TECHNIQUE: Multidetector CT imaging of the abdomen and pelvis was performed
using the standard protocol following bolus administration of
intravenous contrast.

[Series 3: a/p w/ 5mm · axial · 0.92mm/px · z∈[+1354,+1774]mm · 13 of 92 slices shown, 15 images]
[im 4/92  soft-tissue]
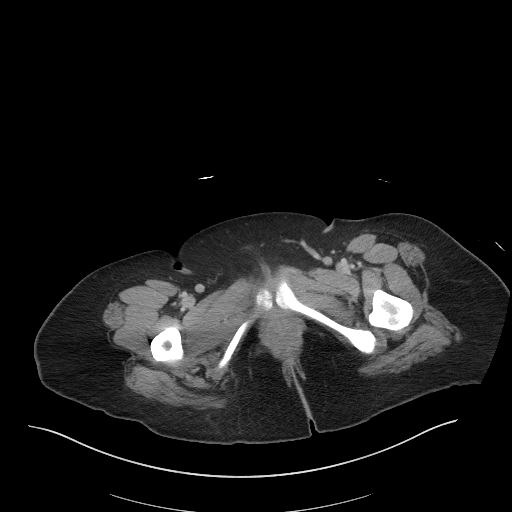
[im 4/92  bone]
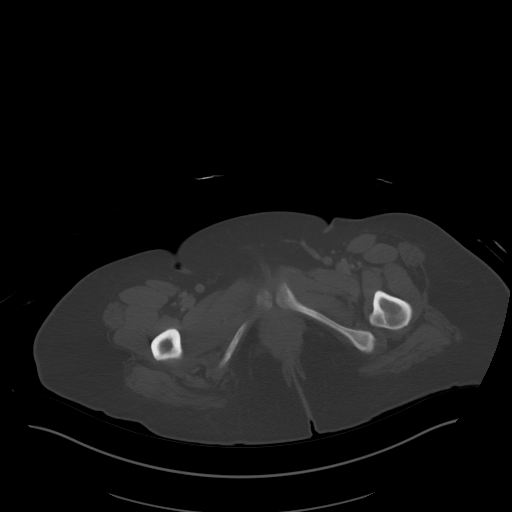
[im 11/92  soft-tissue]
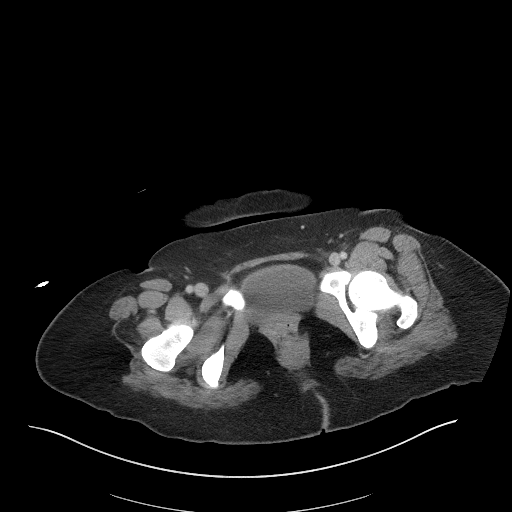
[im 19/92  soft-tissue]
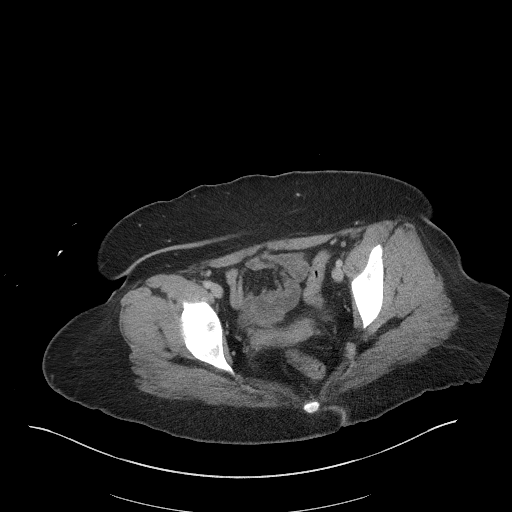
[im 26/92  soft-tissue]
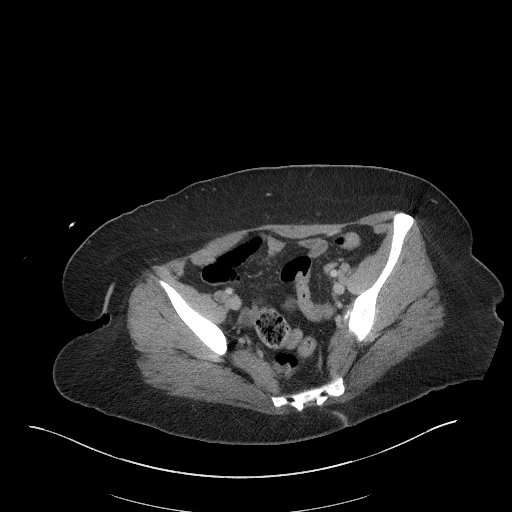
[im 33/92  soft-tissue]
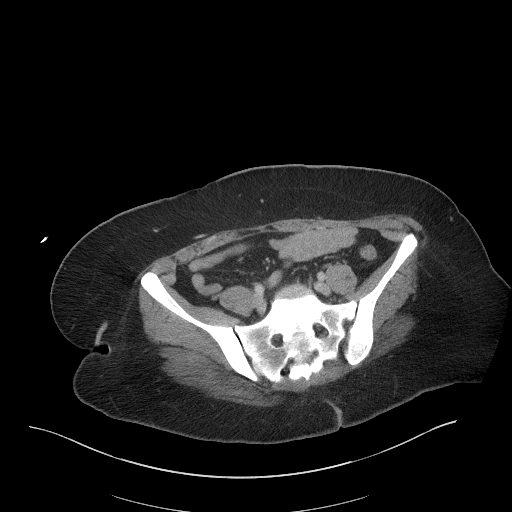
[im 41/92  soft-tissue]
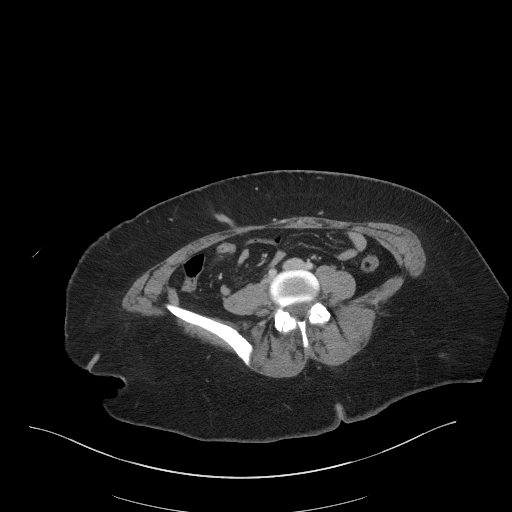
[im 48/92  soft-tissue]
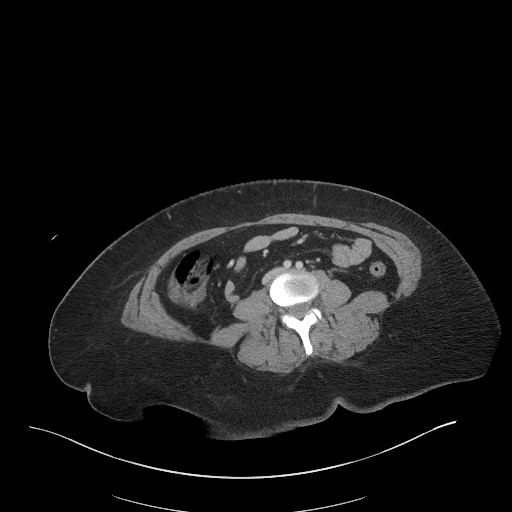
[im 51/92  soft-tissue]
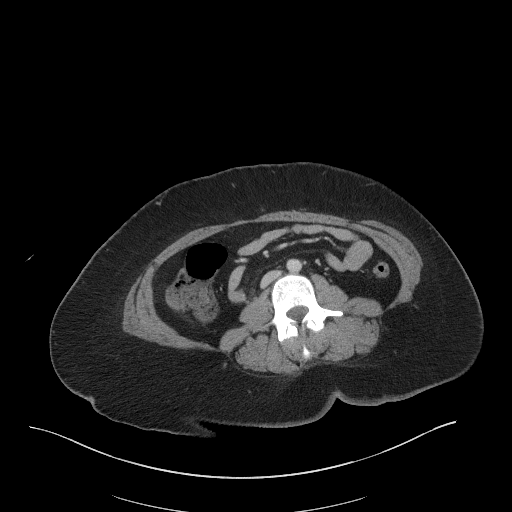
[im 59/92  soft-tissue]
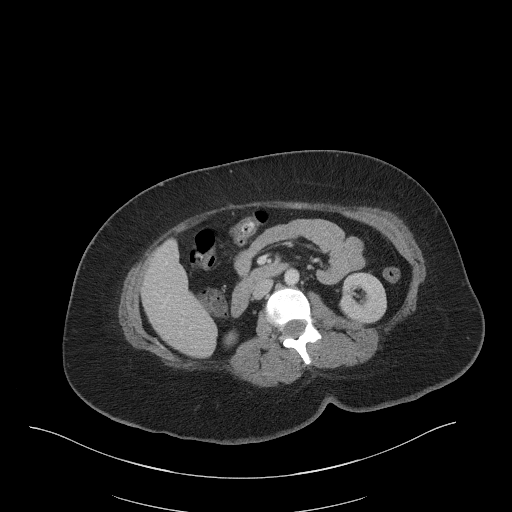
[im 59/92  bone]
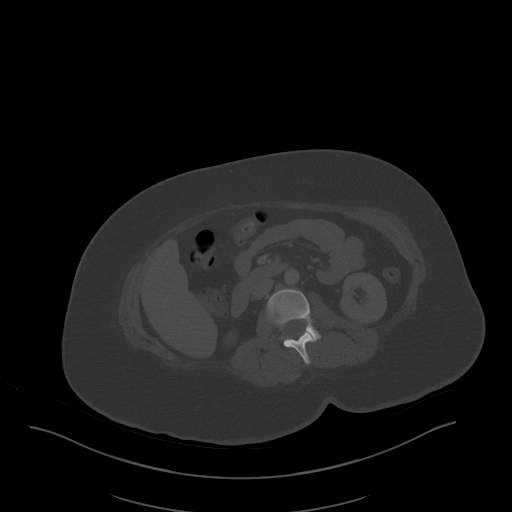
[im 66/92  soft-tissue]
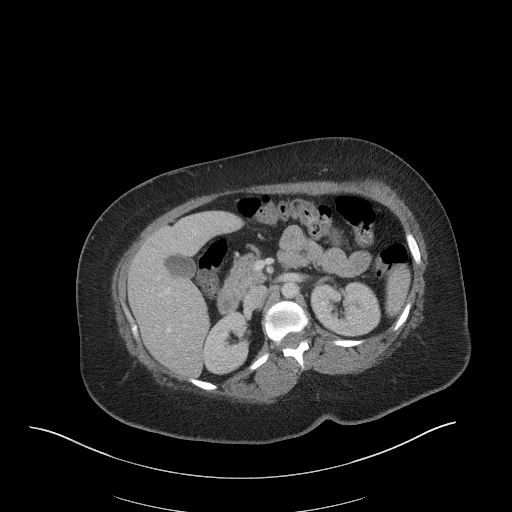
[im 73/92  soft-tissue]
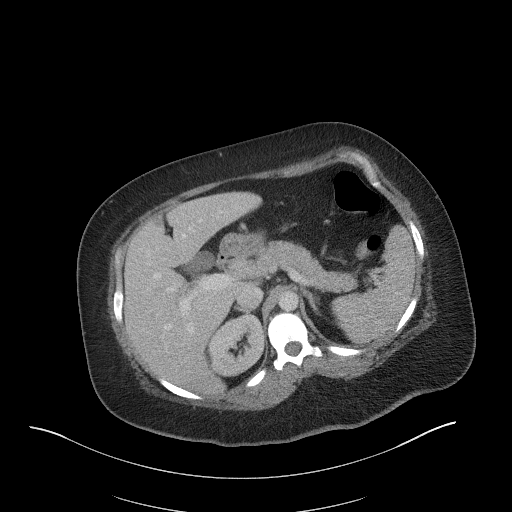
[im 81/92  soft-tissue]
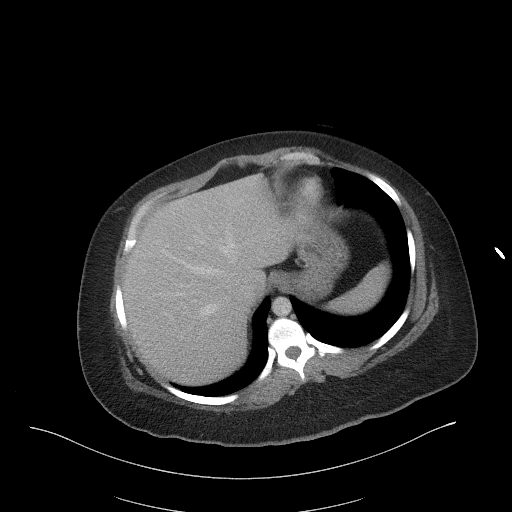
[im 88/92  soft-tissue]
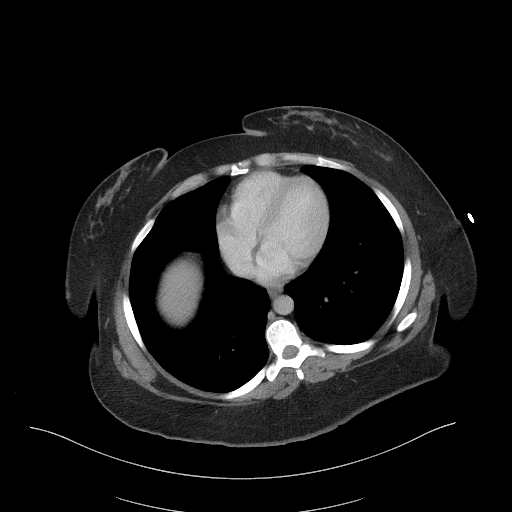

[Series 6: a/p w/ cor · coronal · 0.89mm/px · 3 of 174 slices shown]
[im 58/174  soft-tissue]
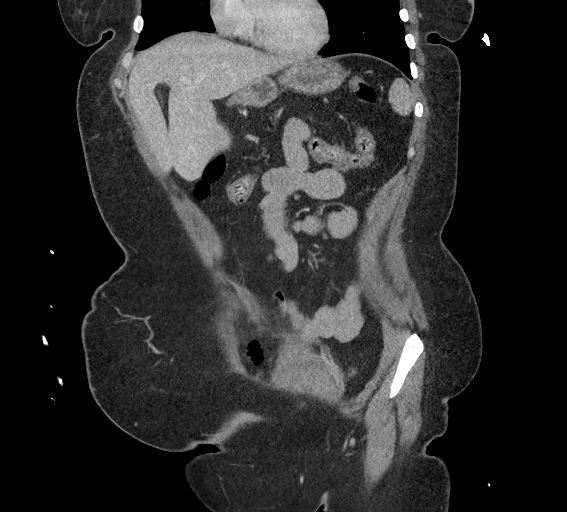
[im 77/174  soft-tissue]
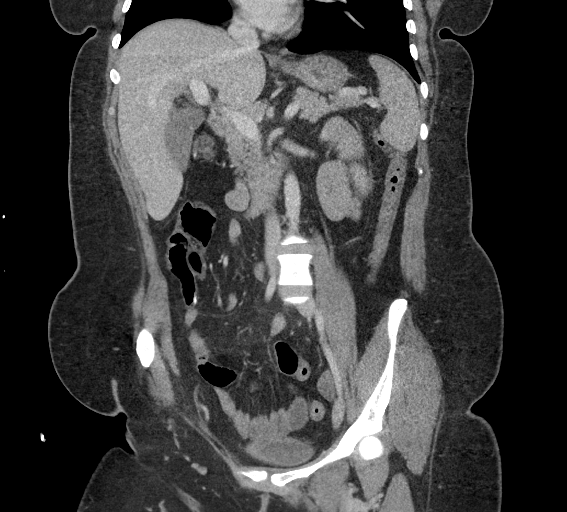
[im 97/174  soft-tissue]
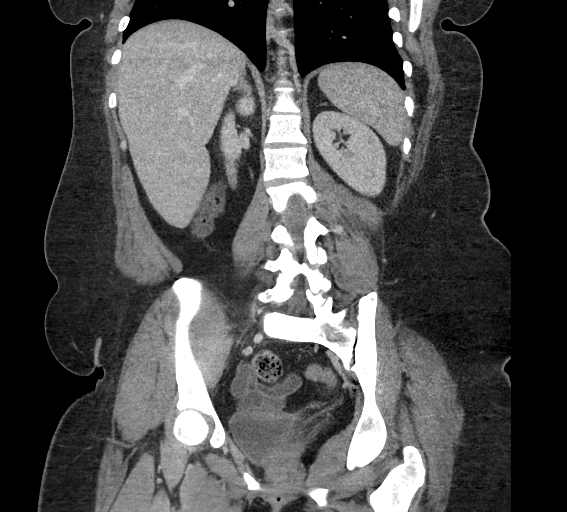

[16 of 46 positions shown; findings below may reference images not displayed]

RADIATION DOSE REDUCTION: This exam was performed according to the
departmental dose-optimization program which includes automated
exposure control, adjustment of the mA and/or kV according to
patient size and/or use of iterative reconstruction technique.

CONTRAST:  100mL OMNIPAQUE IOHEXOL 300 MG/ML  SOLN
FINDINGS: Lower chest: No acute abnormality.

Hepatobiliary: No focal liver abnormality is seen. No gallstones,
gallbladder wall thickening, or biliary dilatation.

Pancreas: Unremarkable. No pancreatic ductal dilatation or
surrounding inflammatory changes.

Spleen: Normal in size without focal abnormality.

Adrenals/Urinary Tract: Adrenal glands are unremarkable. Kidneys are
normal, without renal calculi, focal lesion, or hydronephrosis.
Bladder is unremarkable.

Stomach/Bowel: Stomach is within normal limits. The appendix is
surgically absent. No evidence of bowel wall thickening, distention,
or inflammatory changes.

Vascular/Lymphatic: No significant vascular findings are present. No
enlarged abdominal or pelvic lymph nodes.

Reproductive: Uterus is not identified. The bilateral adnexa are
unremarkable.

Other: No abdominal wall hernia or abnormality. No abdominopelvic
ascites.

Musculoskeletal: No acute or significant osseous findings.
IMPRESSION: 1. No acute or active process within the abdomen or pelvis.
2. Evidence of prior appendectomy.

## 2023-07-15 IMAGING — US US TRANSVAGINAL NON-OB
1 series · 14 of 25 positions shown · non-contrast
Comparison: CT scan of the abdomen and pelvis December 18, 2021

CLINICAL DATA: History of PCOS. Partial hysterectomy. Acute
left-sided pelvic pain last night. Evaluate for torsion.

EXAM:
ULTRASOUND PELVIS TRANSVAGINAL
TECHNIQUE: Transvaginal ultrasound examination of the pelvis was performed
including evaluation of the uterus, ovaries, adnexal regions, and
pelvic cul-de-sac.

[Series 1: us transvaginal non-ob · 41 acquisitions, 14 frames shown]
[im 1/41]
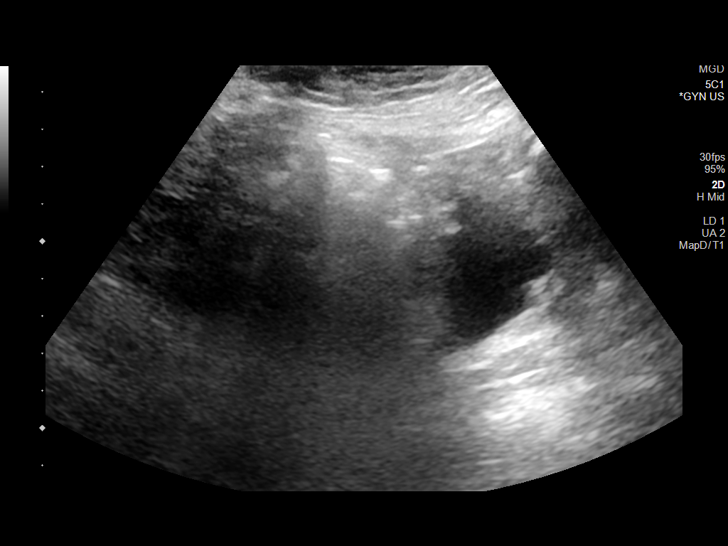
[im 4/41]
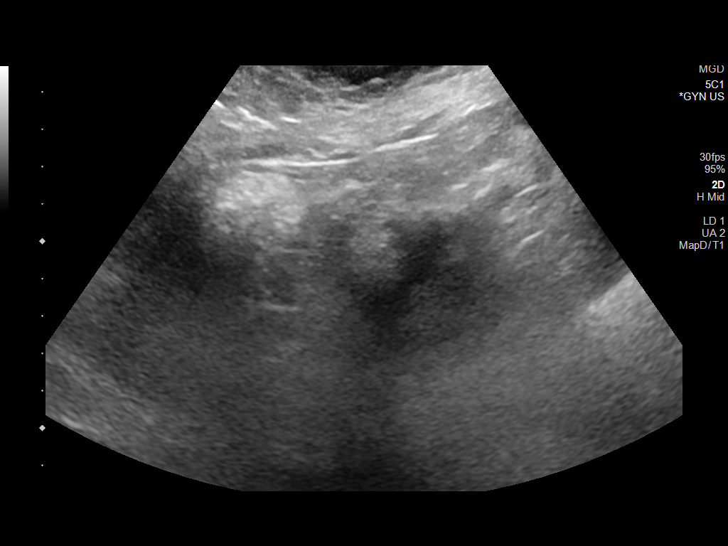
[im 7/41]
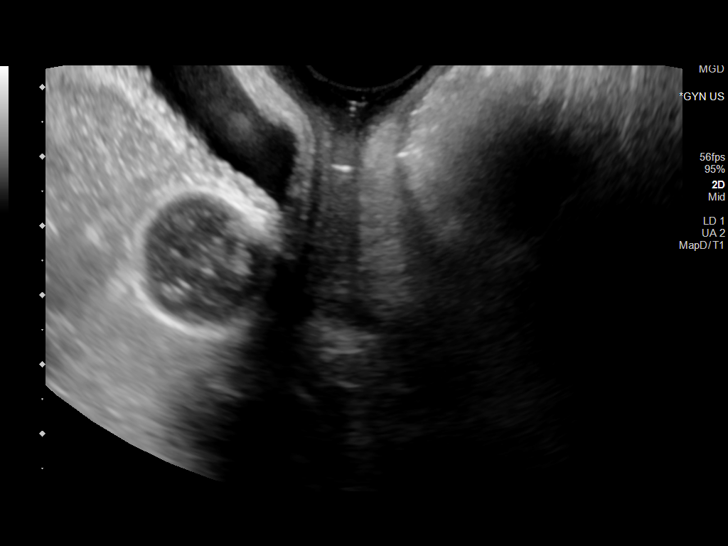
[im 11/41]
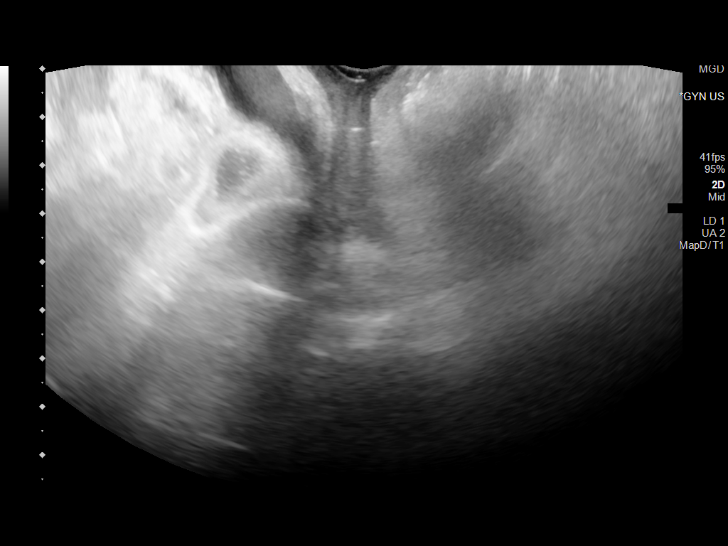
[im 14/41]
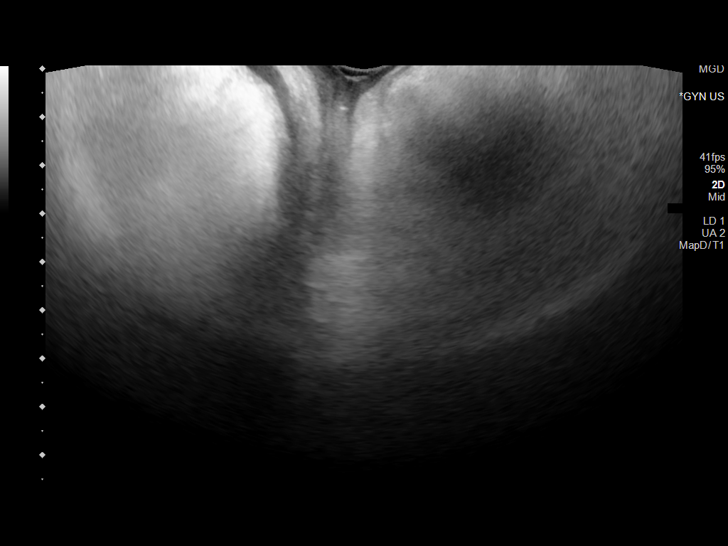
[im 16/41]
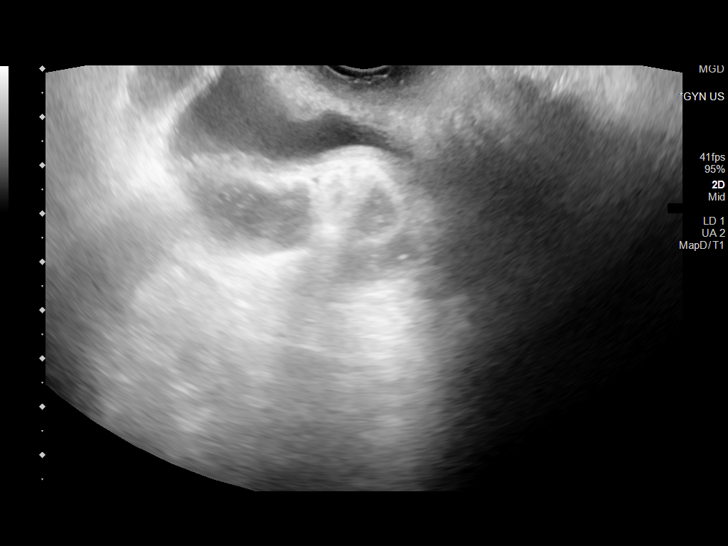
[im 19/41]
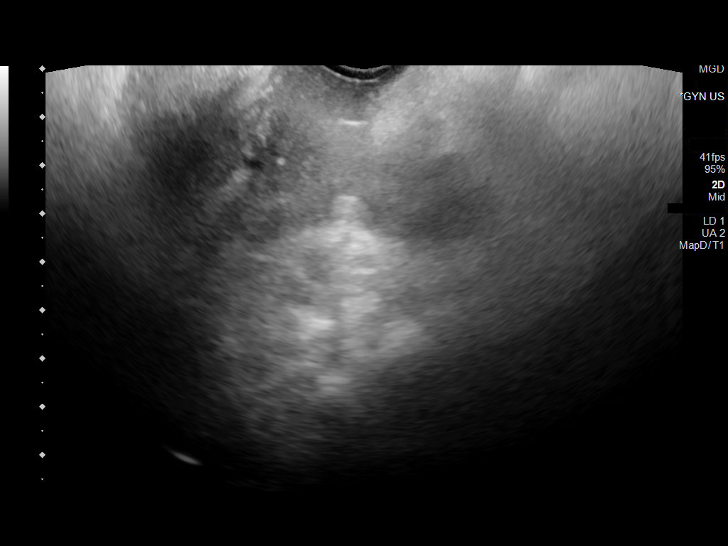
[im 22/41]
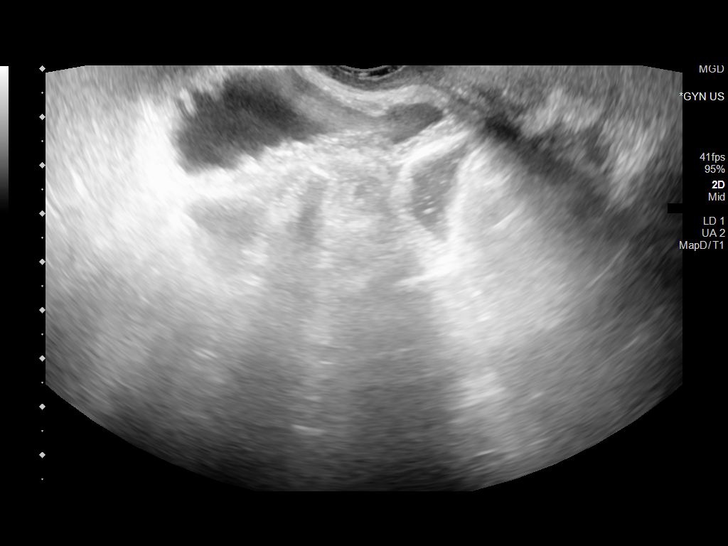
[im 26/41]
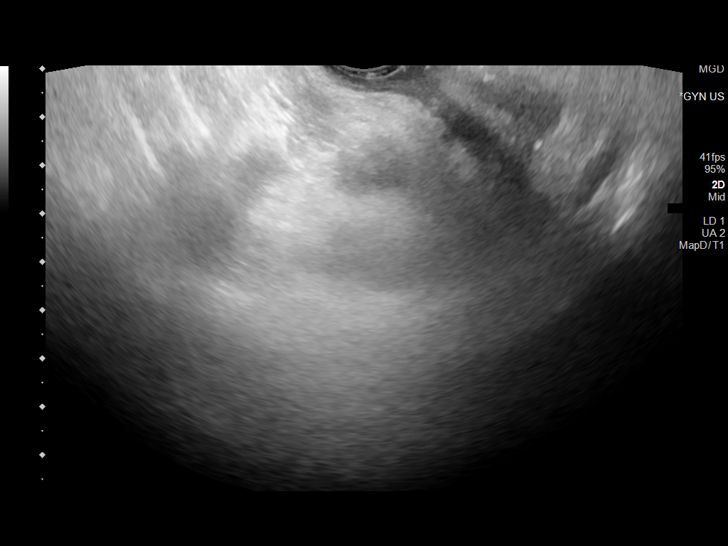
[im 27/41]
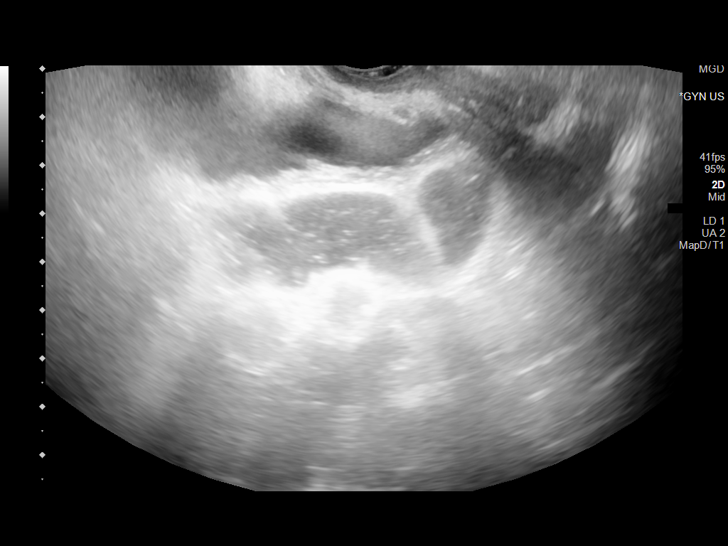
[im 31/41]
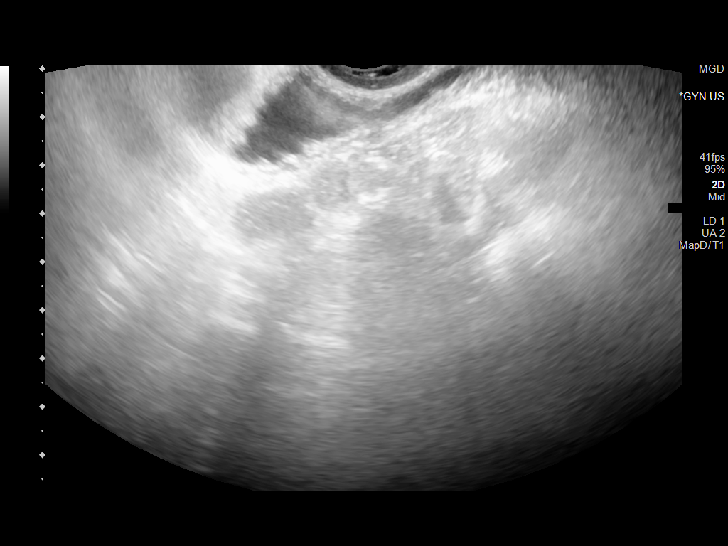
[im 34/41]
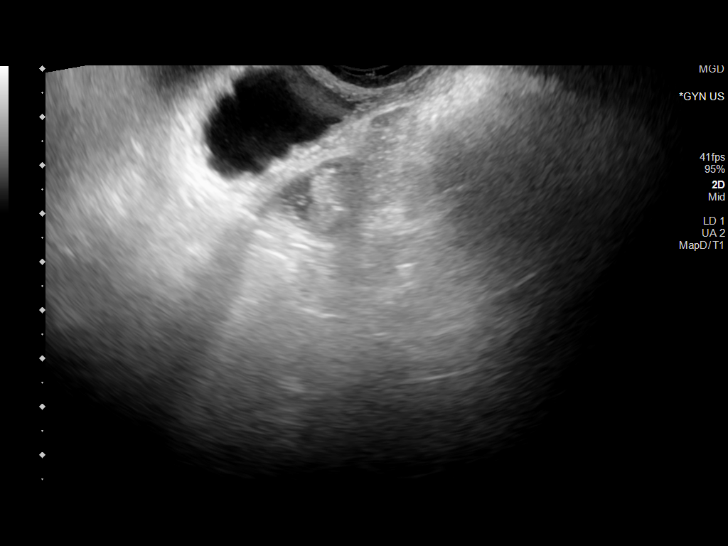
[im 37/41]
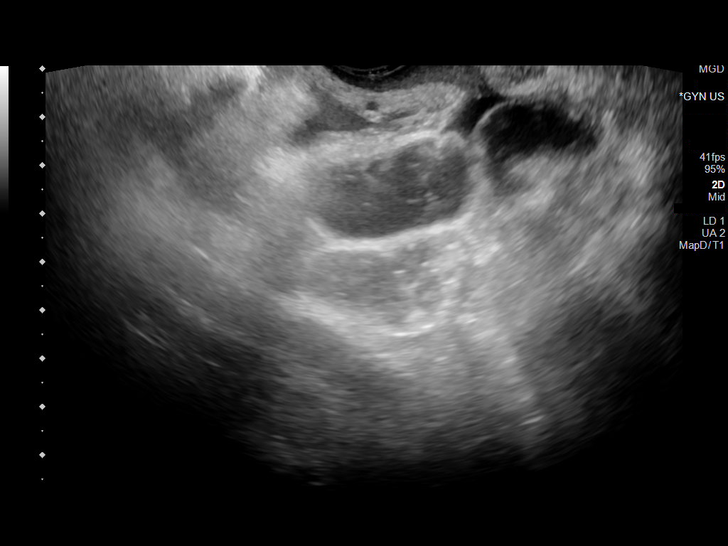
[im 41/41]
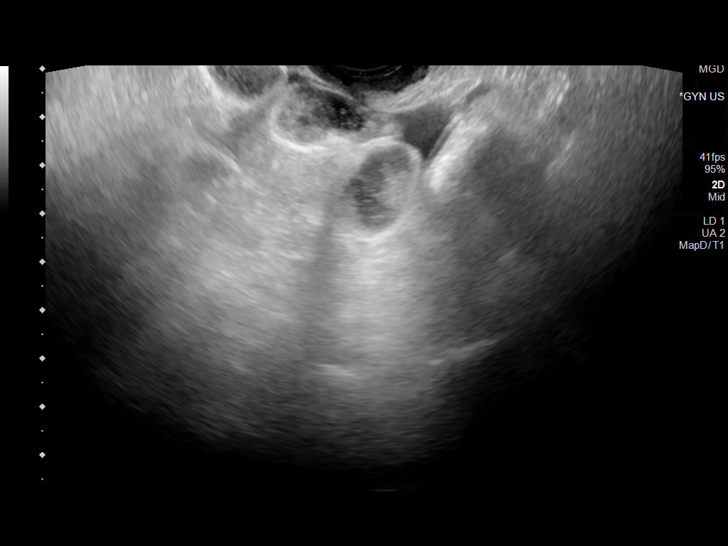

[14 of 25 positions shown; findings below may reference images not displayed]

FINDINGS: Uterus

Surgically absent.

Right ovary

Not visualized.

Left ovary

Not visualized.

Other findings: No free fluid in the pelvis. Multiple peristalsing
loops of small bowel are identified in the pelvis.
IMPRESSION: 1. The uterus is surgically absent.
2. Neither ovary is visualized on today's study.
